# Patient Record
Sex: Female | Born: 1935 | Race: White | Hispanic: No | Smoking: Never smoker
Health system: Southern US, Community
[De-identification: ages and names within clinical notes are randomized; demographics above are authoritative.]

## PROBLEM LIST (undated history)

## (undated) DIAGNOSIS — K5900 Constipation, unspecified: Secondary | ICD-10-CM

## (undated) DIAGNOSIS — I10 Essential (primary) hypertension: Secondary | ICD-10-CM

## (undated) DIAGNOSIS — K219 Gastro-esophageal reflux disease without esophagitis: Secondary | ICD-10-CM

## (undated) DIAGNOSIS — M810 Age-related osteoporosis without current pathological fracture: Secondary | ICD-10-CM

## (undated) DIAGNOSIS — R32 Unspecified urinary incontinence: Secondary | ICD-10-CM

## (undated) DIAGNOSIS — N952 Postmenopausal atrophic vaginitis: Secondary | ICD-10-CM

## (undated) DIAGNOSIS — N362 Urethral caruncle: Secondary | ICD-10-CM

## (undated) DIAGNOSIS — I639 Cerebral infarction, unspecified: Secondary | ICD-10-CM

## (undated) DIAGNOSIS — IMO0002 Reserved for concepts with insufficient information to code with codable children: Secondary | ICD-10-CM

## (undated) DIAGNOSIS — M1990 Unspecified osteoarthritis, unspecified site: Secondary | ICD-10-CM

## (undated) DIAGNOSIS — R351 Nocturia: Secondary | ICD-10-CM

## (undated) DIAGNOSIS — I4891 Unspecified atrial fibrillation: Secondary | ICD-10-CM

## (undated) HISTORY — DX: Gastro-esophageal reflux disease without esophagitis: K21.9

## (undated) HISTORY — DX: Cerebral infarction, unspecified: I63.9

## (undated) HISTORY — DX: Unspecified atrial fibrillation: I48.91

## (undated) HISTORY — DX: Age-related osteoporosis without current pathological fracture: M81.0

## (undated) HISTORY — PX: NO PAST SURGERIES: SHX2092

## (undated) HISTORY — DX: Nocturia: R35.1

## (undated) HISTORY — DX: Essential (primary) hypertension: I10

## (undated) HISTORY — PX: CARPAL TUNNEL RELEASE: SHX101

## (undated) HISTORY — DX: Unspecified urinary incontinence: R32

## (undated) HISTORY — DX: Reserved for concepts with insufficient information to code with codable children: IMO0002

## (undated) HISTORY — DX: Unspecified osteoarthritis, unspecified site: M19.90

## (undated) HISTORY — DX: Postmenopausal atrophic vaginitis: N95.2

## (undated) HISTORY — DX: Constipation, unspecified: K59.00

## (undated) HISTORY — DX: Urethral caruncle: N36.2

---

## 2004-09-16 ENCOUNTER — Emergency Department: Payer: Self-pay | Admitting: Emergency Medicine

## 2004-09-21 ENCOUNTER — Emergency Department: Payer: Self-pay | Admitting: Emergency Medicine

## 2004-09-26 ENCOUNTER — Ambulatory Visit: Payer: Self-pay | Admitting: Gastroenterology

## 2005-02-13 ENCOUNTER — Encounter: Payer: Self-pay | Admitting: Urology

## 2005-03-02 ENCOUNTER — Encounter: Payer: Self-pay | Admitting: Urology

## 2005-03-30 ENCOUNTER — Encounter: Payer: Self-pay | Admitting: Urology

## 2006-01-18 ENCOUNTER — Ambulatory Visit: Payer: Self-pay | Admitting: Internal Medicine

## 2006-08-03 ENCOUNTER — Ambulatory Visit: Payer: Self-pay | Admitting: Urology

## 2006-08-30 ENCOUNTER — Ambulatory Visit: Payer: Self-pay | Admitting: Internal Medicine

## 2007-02-08 ENCOUNTER — Ambulatory Visit: Payer: Self-pay | Admitting: Obstetrics and Gynecology

## 2007-02-11 ENCOUNTER — Ambulatory Visit: Payer: Self-pay | Admitting: Obstetrics and Gynecology

## 2007-05-15 ENCOUNTER — Emergency Department: Payer: Self-pay | Admitting: Emergency Medicine

## 2008-02-09 ENCOUNTER — Ambulatory Visit: Payer: Self-pay | Admitting: Obstetrics and Gynecology

## 2009-03-20 ENCOUNTER — Ambulatory Visit: Payer: Self-pay | Admitting: Internal Medicine

## 2010-02-04 ENCOUNTER — Ambulatory Visit: Payer: Self-pay | Admitting: Internal Medicine

## 2010-02-13 ENCOUNTER — Inpatient Hospital Stay: Payer: Self-pay | Admitting: Internal Medicine

## 2010-03-26 ENCOUNTER — Ambulatory Visit: Payer: Self-pay | Admitting: Internal Medicine

## 2010-04-21 ENCOUNTER — Emergency Department: Payer: Self-pay | Admitting: Emergency Medicine

## 2010-06-17 ENCOUNTER — Ambulatory Visit: Payer: Self-pay | Admitting: Gastroenterology

## 2010-06-19 ENCOUNTER — Emergency Department: Payer: Self-pay | Admitting: Emergency Medicine

## 2010-06-23 ENCOUNTER — Inpatient Hospital Stay: Payer: Self-pay | Admitting: Internal Medicine

## 2010-09-02 ENCOUNTER — Other Ambulatory Visit: Payer: Self-pay | Admitting: Gastroenterology

## 2012-12-23 ENCOUNTER — Ambulatory Visit: Payer: Self-pay | Admitting: Internal Medicine

## 2013-09-29 ENCOUNTER — Ambulatory Visit: Payer: Self-pay | Admitting: Unknown Physician Specialty

## 2013-10-05 ENCOUNTER — Ambulatory Visit: Payer: Self-pay | Admitting: Orthopedic Surgery

## 2013-10-05 LAB — CBC WITH DIFFERENTIAL/PLATELET
Basophil #: 0.1 10*3/uL (ref 0.0–0.1)
HCT: 36.9 % (ref 35.0–47.0)
Lymphocyte #: 1.7 10*3/uL (ref 1.0–3.6)
Lymphocyte %: 26.7 %
MCH: 29.6 pg (ref 26.0–34.0)
Monocyte %: 11 %
Neutrophil %: 55.3 %
Platelet: 264 10*3/uL (ref 150–440)
RBC: 4.27 10*6/uL (ref 3.80–5.20)
RDW: 14.5 % (ref 11.5–14.5)
WBC: 6.4 10*3/uL (ref 3.6–11.0)

## 2013-10-06 ENCOUNTER — Ambulatory Visit: Payer: Self-pay | Admitting: Orthopedic Surgery

## 2013-10-09 LAB — PATHOLOGY REPORT

## 2014-02-12 ENCOUNTER — Ambulatory Visit: Payer: Self-pay | Admitting: Orthopedic Surgery

## 2014-04-18 ENCOUNTER — Emergency Department: Payer: Self-pay | Admitting: Emergency Medicine

## 2014-04-18 LAB — CBC
HCT: 37 % (ref 35.0–47.0)
HGB: 12.2 g/dL (ref 12.0–16.0)
MCH: 28.9 pg (ref 26.0–34.0)
MCHC: 33 g/dL (ref 32.0–36.0)
MCV: 88 fL (ref 80–100)
PLATELETS: 262 10*3/uL (ref 150–440)
RBC: 4.21 10*6/uL (ref 3.80–5.20)
RDW: 14.8 % — ABNORMAL HIGH (ref 11.5–14.5)
WBC: 6.2 10*3/uL (ref 3.6–11.0)

## 2014-04-18 LAB — COMPREHENSIVE METABOLIC PANEL
ALBUMIN: 3.3 g/dL — AB (ref 3.4–5.0)
ALT: 14 U/L (ref 12–78)
AST: 23 U/L (ref 15–37)
Alkaline Phosphatase: 55 U/L
Anion Gap: 4 — ABNORMAL LOW (ref 7–16)
BILIRUBIN TOTAL: 0.3 mg/dL (ref 0.2–1.0)
BUN: 12 mg/dL (ref 7–18)
CO2: 30 mmol/L (ref 21–32)
Calcium, Total: 8.5 mg/dL (ref 8.5–10.1)
Chloride: 104 mmol/L (ref 98–107)
Creatinine: 0.84 mg/dL (ref 0.60–1.30)
EGFR (Non-African Amer.): >60
Glucose: 131 mg/dL — ABNORMAL HIGH (ref 65–99)
OSMOLALITY: 277 (ref 275–301)
Potassium: 3.8 mmol/L (ref 3.5–5.1)
Sodium: 138 mmol/L (ref 136–145)
Total Protein: 6.6 g/dL (ref 6.4–8.2)

## 2014-04-18 LAB — URINALYSIS, COMPLETE
Bilirubin,UR: NEGATIVE
GLUCOSE, UR: NEGATIVE mg/dL (ref 0–75)
Ketone: NEGATIVE
Nitrite: POSITIVE
PROTEIN: NEGATIVE
Ph: 7 (ref 4.5–8.0)
Specific Gravity: 1.005 (ref 1.003–1.030)
Squamous Epithelial: 1
WBC UR: 3 /HPF (ref 0–5)

## 2014-04-18 LAB — TROPONIN I

## 2014-05-09 DIAGNOSIS — I4891 Unspecified atrial fibrillation: Secondary | ICD-10-CM | POA: Insufficient documentation

## 2014-05-09 DIAGNOSIS — I10 Essential (primary) hypertension: Secondary | ICD-10-CM | POA: Insufficient documentation

## 2014-12-25 DIAGNOSIS — G609 Hereditary and idiopathic neuropathy, unspecified: Secondary | ICD-10-CM | POA: Insufficient documentation

## 2014-12-25 DIAGNOSIS — G629 Polyneuropathy, unspecified: Secondary | ICD-10-CM | POA: Insufficient documentation

## 2014-12-25 DIAGNOSIS — E538 Deficiency of other specified B group vitamins: Secondary | ICD-10-CM | POA: Insufficient documentation

## 2015-01-30 DIAGNOSIS — G5603 Carpal tunnel syndrome, bilateral upper limbs: Secondary | ICD-10-CM | POA: Insufficient documentation

## 2015-03-22 NOTE — Op Note (Signed)
PATIENT NAME:  Amber Holland, Amber Holland MR#:  [id number] DATE OF BIRTH:  [birth year]  DATE OF PROCEDURE:  10/06/2013  PREOPERATIVE DIAGNOSIS: L4 compression fracture.   POSTOPERATIVE DIAGNOSIS: L4 compression fracture.  PROCEDURE: L4 biopsy and kyphoplasty.   ANESTHESIA: MAC.   SURGEON: Kennedy BuckerMichael Mivaan Corbitt, M.D.   DESCRIPTION OF PROCEDURE: The patient was brought to the operating room and after adequate sedation was given, the patient was placed prone, and C-arm was brought in and good visualization of L4 was obtained in both AP and lateral projections. After appropriate timeout procedure, 5 mL of 1% Xylocaine was infiltrated subcutaneously after prepping the skin with alcohol. The back was then prepped and draped in the usual sterile manner with a repeat timeout procedure carried out. At this point, the spinal needle was used to give local down to the pedicle on both sides. A small stab incision was made and following the trocar on both AP and lateral projections, it went through the pedicle into the vertebral body, first on the right where a biopsy was obtained, drilling carried out and then a balloon inserted and inflated. The left side had identical procedure without the biopsy. After inflation of the balloons, the cement was inserted throught the cannula  with the appropriate consistency with good fill being obtained. Approximately 6 mL of bone cement was inserted. There was no extravasation. The trocars were removed and permanent C-arm views obtained. The wounds were closed with Dermabond followed by Band-Aids and the patient was sent to the recovery room in stable condition.   ESTIMATED BLOOD LOSS: Minimal.   COMPLICATIONS: None.   SPECIMEN: L4 vertebral body biopsy.   ____________________________ Leitha SchullerMichael J. Jaycie Kregel, MD mjm:aw D: 10/06/2013 16:24:22 ET T: 10/06/2013 16:54:17 ET JOB#: [id number]  cc: Leitha SchullerMichael J. Ruie Sendejo, MD, <Dictator> Leitha SchullerMICHAEL J Florestine Carmical MD ELECTRONICALLY SIGNED 10/07/2013 18:21

## 2015-06-17 ENCOUNTER — Ambulatory Visit: Payer: Medicare Other | Attending: Orthopedic Surgery | Admitting: Occupational Therapy

## 2015-06-17 DIAGNOSIS — R209 Unspecified disturbances of skin sensation: Secondary | ICD-10-CM | POA: Insufficient documentation

## 2015-06-17 NOTE — Patient Instructions (Signed)
LMB splint  on for R 3rd digit to increase PIP extention  Tendon gliding , opposition   Med N glide   Scar massage

## 2015-06-17 NOTE — Therapy (Signed)
[facility] [address] [place], [place], [address] Phone: [phone]   Fax:  [phone]  Occupational Therapy Treatment  Patient Details  Name: Amber RuedMargaret L Dockery MRN: [id number] Date of Birth: 05/27/1920 Referring Provider:  Kennedy BuckerMenz, Michael, MD  Encounter Date: 06/17/2015      OT End of Session - 06/17/15 2124    Visit Number 1   Number of Visits 6   Date for OT Re-Evaluation 07/08/15   OT Start Time 1434   OT Stop Time 1537   OT Time Calculation (min) 63 min   Activity Tolerance Patient tolerated treatment well   Behavior During Therapy [facility] for tasks assessed/performed      No past medical history on file.  No past surgical history on file.  There were no vitals filed for this visit.  Visit Diagnosis:  Abnormal sensation of upper extremity - Plan: Ot plan of care cert/re-cert      Subjective Assessment - 06/17/15 2114    Subjective  I had my R CTR 04/08/15 and about month before that my L - and that one did fine   Patient Stated Goals I want to use my R hand better , and get the numbness and function better like the L one    Currently in Pain? No/denies            [facility] - 06/17/15 0001    Assessment   Diagnosis CTR bilateral   Onset Date 04/08/15   Assessment Pt present with R CTR more than 2 months out -  R hand cont to have numbness at thumb thru 3rd , report decrease FMC and use of hand - refer to hand therapy    Home  Environment   Lives With Alone   Prior Function   Level of Independence Independent with basic ADLs   Leisure Likes to do some housework, playing cards, reading, puszzle, and did some crocheing in past    Strength   Right Hand Grip (lbs) 30   Right Hand Lateral Pinch 10 lbs   Right Hand 3 Point Pinch 7 lbs   Left Hand Grip (lbs) 20   Left Hand Lateral Pinch 9 lbs   Left Hand 3 Point Pinch 7 lbs   Right Hand AROM   R Index  MCP 0-90 85 Degrees   R Long  PIP 0-100 90 Degrees  -30   R Ring PIP 0-100 --  -10                  OT Treatments/Exercises (OP) - 06/17/15 0001    RUE Paraffin   Number Minutes Paraffin 10 Minutes   RUE Paraffin Location Hand   Comments Prior to scar massgae ed , ther ex                 OT Education - 06/17/15 2124    Education provided Yes   Education Details HEP   Person(s) Educated Patient;Other (comment)   Methods Explanation;Demonstration;Tactile cues;Verbal cues;Handout   Comprehension Verbalized understanding;Returned demonstration;Verbal cues required;Tactile cues required;Need further instruction          OT Short Term Goals - 06/17/15 2134    OT SHORT TERM GOAL #1   Title Pt. will be indep. with HEP to increase R 3rd digit extention and sensation    Baseline -30 3rd , 4th -10 extention   Time 2   Period Weeks   Status New   OT  SHORT TERM GOAL #2   Title Sensation improve on [employer] one level to be able to identify 3/3 objects with R hand    Baseline deep pressure and 4.56 at the best at 2nd prox phalanges   Time 3   Period Weeks   Status New           OT Long Term Goals - 15-Jul-2015 04-27-2138    OT LONG TERM GOAL #1   Title Pt to have knowledge of 1-3 AE to use to increase independence    Baseline no knowledge    Period Weeks   Status New               Plan - 07-15-15 04-27-2124    Clinical Impression Statement Pt present more than 8 wks postop R CTR - cont to have sensory issue in R hand with 3rd the worse - but only deep pressure on thumb thru 3rd - could feel 4.56 diminished protective sensation on prox phalanges of 2nd -  decrease 3rd and 4th PIP extention- all limiting her dexterity/fmc and propioception -  ,   Pt will benefit from skilled therapeutic intervention in order to improve on the following deficits (Retired) Impaired flexibility;Impaired sensation;Impaired UE functional use;Decreased range of motion;Decreased scar mobility;Decreased  strength;Decreased coordination   Rehab Potential Fair   Clinical Impairments Affecting Rehab Potential age, memory,   OT Frequency 2x / week   OT Duration Other (comment)   OT Treatment/Interventions Patient/family education;Therapeutic exercises;Passive range of motion;Scar mobilization;Manual Therapy;Parrafin;Ultrasound;Self-care/ADL training   Plan Assess LMB splint use , and HEP    OT Home Exercise Plan see pt instruction     Consulted and Agree with Plan of Care Patient;Other (Comment)          G-Codes - 2015/07/15 04/27/40    Functional Assessment Tool Used ROM, grip and prehension strength, Semmes Weinstien and clinical judgement   Functional Limitation Self care   Self Care Current Status [phone]) At least 40 percent but less than 60 percent impaired, limited or restricted   Self Care Goal Status ([id number]) At least 20 percent but less than 40 percent impaired, limited or restricted      Problem List There are no active problems to display for this patient.   Oletta Cohn OTR/L, CLT July 15, 2015, 9:45 PM  [facility] PHYSICAL AND SPORTS MEDICINE [address] [place], [address] Phone: [phone]   Fax:  [phone]

## 2015-06-19 ENCOUNTER — Ambulatory Visit: Payer: Medicare Other | Admitting: Occupational Therapy

## 2015-06-19 DIAGNOSIS — R209 Unspecified disturbances of skin sensation: Secondary | ICD-10-CM | POA: Diagnosis not present

## 2015-06-19 NOTE — Patient Instructions (Signed)
Same HEP - needed Mod A for Nerve glide and lumbrical fist during tendon gliding

## 2015-06-19 NOTE — Therapy (Signed)
[facility] [address] [place], [place], [address] Phone: [phone]   Fax:  [phone]  Occupational Therapy Treatment  Patient Details  Name: Amber RuedMargaret L Stiehl MRN: [id number] Date of Birth: 07-08-1920 Referring Provider:  Kennedy BuckerMenz, Michael, MD  Encounter Date: 06/19/2015      OT End of Session - 06/19/15 1517    Visit Number 2   Number of Visits 6   Date for OT Re-Evaluation 07/08/15   OT Start Time 1429   OT Stop Time 1515   OT Time Calculation (min) 46 min   Activity Tolerance Patient tolerated treatment well   Behavior During Therapy [facility] for tasks assessed/performed      No past medical history on file.  No past surgical history on file.  There were no vitals filed for this visit.  Visit Diagnosis:  Abnormal sensation of upper extremity      Subjective Assessment - 06/19/15 1438    Subjective  I felt very good today - used R hand more - like eating ,and some cleaning in the corners - still hard time ear rings in    Patient Stated Goals I want to use my R hand better , and get the numbness and function better like the L one    Currently in Pain? No/denies            [facility] Assessment - 06/19/15 0001    Right Hand AROM   R Long PIP 0-100 95 Degrees  -20 coming in                   OT Treatments/Exercises (OP) - 06/19/15 0001    Hand Exercises   Other Hand Exercises PROM of 3rd PIP exention , lumbrical stretch PIP extention    Other Hand Exercises Tendon gliding AROM and AAROm for lumbrical fist , 3rd digit flexion with others blocked ;Med N glides 5 reps    Ultrasound   Ultrasound Location CT scar   Ultrasound Parameters CUS at 3.3MHZ for 4 min prior to manaul thepay    Ultrasound Goals Other (Comment)   RUE Paraffin   Number Minutes Paraffin 10 Minutes   RUE Paraffin Location Hand   [facility] numnbess and increase ROM    Manual Therapy   Manual  Therapy Soft tissue mobilization   Manual therapy comments Scar massage and mobs done after US and parafin - ed pt to do at home                 OT Education - 06/19/15 1517    Education provided Yes   Education Details HEP   Person(s) Educated Patient   Methods Explanation;Demonstration;Tactile cues   Comprehension Verbalized understanding;Returned demonstration;Verbal cues required;Need further instruction          OT Short Term Goals - 06/17/15 2134    OT SHORT TERM GOAL #1   Title Pt. will be indep. with HEP to increase R 3rd digit extention and sensation    Baseline -30 3rd , 4th -10 extention   Time 2   Period Weeks   Status New   OT SHORT TERM GOAL #2   Title Sensation improve on [employer] one level to be able to identify 3/3 objects with R hand    Baseline deep pressure and 4.56 at the best at 2nd prox phalanges   Time 3   Period Weeks   Status New  OT Long Term Goals - 06/17/15 2139    OT LONG TERM GOAL #1   Title Pt to have knowledge of 1-3 AE to use to increase independence    Baseline no knowledge    Period Weeks   Status New               Plan - 06/19/15 1518    Clinical Impression Statement Pt report she used her R hand better this date , but still numb the 3rd digit more than L - did show increase PIP extention at 3rd coming in - cont with same HEP to decrease sensory impairement , ROM , grip    Pt will benefit from skilled therapeutic intervention in order to improve on the following deficits (Retired) Impaired flexibility;Impaired sensation;Impaired UE functional use;Decreased range of motion;Decreased scar mobility;Decreased strength;Decreased coordination   Rehab Potential Fair   Clinical Impairments Affecting Rehab Potential age, memory,   OT Frequency 2x / week   OT Duration Other (comment)   OT Treatment/Interventions Patient/family education;Therapeutic exercises;Passive range of motion;Scar mobilization;Manual  Therapy;Parrafin;Ultrasound;Self-care/ADL training   Plan Review HEP and upgrade    OT Home Exercise Plan see pt instruction     Consulted and Agree with Plan of Care Patient        Problem List There are no active problems to display for this patient.   Grayling Schranz OTR/L, CLT 06/19/2015, 3:22 PM  [place] PHYSICAL AND SPORTS MEDICINE [address], [place], [address] Phone: [phone]   Fax:  [phone]

## 2015-06-20 ENCOUNTER — Encounter: Payer: Medicare Other | Admitting: Occupational Therapy

## 2015-06-24 ENCOUNTER — Ambulatory Visit: Payer: Medicare Other | Admitting: Occupational Therapy

## 2015-06-24 DIAGNOSIS — R209 Unspecified disturbances of skin sensation: Secondary | ICD-10-CM | POA: Diagnosis not present

## 2015-06-24 NOTE — Therapy (Signed)
[facility] PH[facility]TS MEDICINE [address], [place], [address] Phone: [phone]   Fax:  [phone]  Occupational Therapy Treatment  Patient Details  Name: Amber Holland MRN: [id number] Date of Birth: 1920/05/02 Referring Provider:  Kennedy Bucker, MD  Encounter Date: 06/24/2015      OT End of Session - 06/24/15 1529    Visit Number 3   Number of Visits 6   Date for OT Re-Evaluation 07/08/15   OT Start Time 1432   OT Stop Time 1520   OT Time Calculation (min) 48 min   Activity Tolerance Patient tolerated treatment well   Behavior During Therapy [facility] for tasks assessed/performed      No past medical history on file.  No past surgical history on file.  There were no vitals filed for this visit.  Visit Diagnosis:  Abnormal sensation of upper extremity      Subjective Assessment - 06/24/15 1525    Subjective  I am using it more - an I think part of it , is that I feel better -and that middle finger has arthritis in - I did the scar massage but don't know if I am doing it correctly    Patient Stated Goals I want to use my R hand better , and get the numbness and function better like the L one    Currently in Pain? No/denies            [facility] OT Assessment - 06/24/15 0001    Strength   Right Hand Grip (lbs) 30   Right Hand Lateral Pinch 11 lbs   Right Hand 3 Point Pinch 7 lbs   Left Hand Grip (lbs) 25   Left Hand Lateral Pinch 12 lbs   Left Hand 3 Point Pinch 7 lbs   Right Hand AROM   R Index  MCP 0-90 85 Degrees   R Long PIP 0-100 98 Degrees  -30   R Ring PIP 0-100 --  -10                  OT Treatments/Exercises (OP) - 06/24/15 0001    Hand Exercises   Other Hand Exercises PROM of 3rd PIP exention , lumbrical stretch PIP extention    Other Hand Exercises Tendon gliding AROM and AAROm for lumbrical fist , 3rd digit flexion with others blocked ;Med N glides 5 reps    RUE Paraffin   Number Minutes Paraffin 10 Minutes   RUE Paraffin Location Hand   Comments At [facility] to decrease scar massge    Manual Therapy   Manual Therapy Soft tissue mobilization   Manual therapy comments Scar massage and mobs done afterparafin - ed pt to do at home ; sensory reed , finding small objects in rice using only first 3 digits - but needed to look - also did [facility] - out of hand to figner manipulation - 3point out                 OT Education - 06/24/15 1528    Education provided Yes   Education Details same HEP   Person(s) Educated Patient   Methods Explanation;Demonstration;Tactile cues   Comprehension Returned demonstration;Verbalized understanding;Verbal cues required          OT Short Term Goals - 06/24/15 1531    OT SHORT TERM GOAL #1   Title Pt. will be indep. with HEP to increase R 3rd digit extention and sensation  Baseline -30 3rd , 4th -10 extention   Time 2   Period Weeks   Status On-going   OT SHORT TERM GOAL #2   Title Sensation improve on [employer] one level to be able to identify 3/3 objects with R hand    Baseline deep pressure and 4.56 at the best at 2nd prox phalanges   Time 2   Period Weeks   Status On-going           OT Long Term Goals - 06/24/15 1531    OT LONG TERM GOAL #1   Title Pt to have knowledge of 1-3 AE to use to increase independence    Baseline no knowledge    Time 2   Period Weeks   Status On-going               Plan - 06/24/15 1529    Clinical Impression Statement Pt appear to use hand better , lateral grip improved on R and grip on L - pt had hardtime with 3 point grip because of sensation problems - 3rd digit the worse - did small objects out of rice and could not do with first 3 digits - had to look    Pt will benefit from skilled therapeutic intervention in order to improve on the following deficits (Retired) Impaired flexibility;Impaired sensation;Impaired UE functional use;Decreased range of  motion;Decreased scar mobility;Decreased strength;Decreased coordination   Rehab Potential Fair   Clinical Impairments Affecting Rehab Potential age, memory,   OT Frequency 2x / week   OT Duration 2 weeks   OT Treatment/Interventions Patient/family education;Therapeutic exercises;Passive range of motion;Scar mobilization;Manual Therapy;Parrafin;Ultrasound;Self-care/ADL training   Plan HEP , semmes weinstein    OT Home Exercise Plan see pt instruction     Consulted and Agree with Plan of Care Patient        Problem List There are no active problems to display for this patient.   Oletta Cohn OTR/L;CLT  06/24/2015, 3:33 PM  [place] [facility] PHYSICAL [facility] SPORTS MEDICINE [address], [place], [address] Phone: [phone]   Fax:  [phone]

## 2015-06-26 ENCOUNTER — Ambulatory Visit: Payer: Medicare Other | Admitting: Occupational Therapy

## 2015-06-26 DIAGNOSIS — R209 Unspecified disturbances of skin sensation: Secondary | ICD-10-CM | POA: Diagnosis not present

## 2015-06-26 NOTE — Therapy (Signed)
[place] [facility] PHYSICAL [facility] SPORTS MEDICINE [address] [place], [address] Phone: [phone]   Fax:  [phone]  Occupational Therapy Treatment  Patient Details  Name: Amber Holland MRN: [id number] Date of Birth: 08-Jan-1920 Referring Provider:  Kennedy Bucker, MD  Encounter Date: 06/26/2015      OT End of Session - 06/26/15 2120    Visit Number 4   Number of Visits 6   Date for OT Re-Evaluation 07/08/15   OT Start Time 1433   OT Stop Time 1522   OT Time Calculation (min) 49 min   Activity Tolerance Patient tolerated treatment well   Behavior During Therapy [facility] for tasks assessed/performed      No past medical history on file.  No past surgical history on file.  There were no vitals filed for this visit.  Visit Diagnosis:  Abnormal sensation of upper extremity      Subjective Assessment - 06/26/15 2110    Subjective  My hand is just not doing good today - I woke up this am with my middle finger hurting and more pins and needles  but it could be my arthritis too - I just sweep some today    Patient Stated Goals I want to use my R hand better , and get the numbness and function better like the L one    Currently in Pain? Other (Comment)                      OT Treatments/Exercises (OP) - 06/26/15 0001    Hand Exercises   Other Hand Exercises THumb PA and RA , Med N glides - still needs Mod A for HEP   Other Hand Exercises Tendon gliding  done    Ultrasound   Ultrasound Location CT scar on R   Ultrasound Parameters CUS at 3.66mhz at 0.8 intensity forr 5 min prior to scar massage and CT spreads   Ultrasound Goals Pain   RUE Paraffin   Number Minutes Paraffin 10 Minutes   RUE Paraffin Location Hand   Comments At [facility] to decreaes pain    Manual Therapy   Manual Therapy Soft tissue mobilization   Manual therapy comments Scar massage and mobs done afterparafin - ed pt to do at home ; sensory reed , finding  small objects in rice using only first 3 digits - but needed to look - also did [facility] - out of hand to figner manipulation - 3point out                 OT Education - 06/26/15 2119    Education provided Yes   Education Details Same HEP and sensory reed   Person(s) Educated Patient   Methods Explanation;Demonstration;Tactile cues;Verbal cues   Comprehension Verbalized understanding;Returned demonstration;Verbal cues required;Tactile cues required          OT Short Term Goals - 06/24/15 1531    OT SHORT TERM GOAL #1   Title Pt. will be indep. with HEP to increase R 3rd digit extention and sensation    Baseline -30 3rd , 4th -10 extention   Time 2   Period Weeks   Status On-going   OT SHORT TERM GOAL #2   Title Sensation improve on [employer] one level to be able to identify 3/3 objects with R hand    Baseline deep pressure and 4.56 at the best at 2nd prox phalanges   Time 2   Period Weeks  Status On-going           OT Long Term Goals - 06/24/15 1531    OT LONG TERM GOAL #1   Title Pt to have knowledge of 1-3 AE to use to increase independence    Baseline no knowledge    Time 2   Period Weeks   Status On-going               Plan - 06/26/15 2122    Clinical Impression Statement Pt had increase pain and sensory issue per pt this date - got up this am with it - could be way pt slept or she has history of arthritis - pt to pay attention how she sleeps with hands and do some sensory issue during HEP and day time - sensation in hand and proximal phalanges better than at DIP's and PIP 's - pt to cont 2 x wk for 1 more wk   Pt will benefit from skilled therapeutic intervention in order to improve on the following deficits (Retired) Impaired flexibility;Impaired sensation;Impaired UE functional use;Decreased range of motion;Decreased scar mobility;Decreased strength;Decreased coordination   Rehab Potential Fair   Clinical Impairments Affecting Rehab Potential  age, memory,   OT Frequency 2x / week   OT Duration 2 weeks   OT Treatment/Interventions Patient/family education;Therapeutic exercises;Passive range of motion;Scar mobilization;Manual Therapy;Parrafin;Ultrasound;Self-care/ADL training   Plan Pain next sensation and HEP - assess grip and prehension    OT Home Exercise Plan see pt instruction     Consulted and Agree with Plan of Care Patient        Problem List There are no active problems to display for this patient.   Oletta Cohn OTR/L,CLT 06/26/2015, 9:26 PM  [place] [facility] PHYSICAL AND SPORTS MEDICINE [address], [place], [address] Phone: [phone]   Fax:  [phone]

## 2015-06-26 NOTE — Patient Instructions (Signed)
Reinforce for pt to do sensory reed HEP - different textures

## 2015-07-01 ENCOUNTER — Ambulatory Visit: Payer: Medicare Other | Attending: Orthopedic Surgery | Admitting: Occupational Therapy

## 2015-07-01 DIAGNOSIS — R209 Unspecified disturbances of skin sensation: Secondary | ICD-10-CM | POA: Diagnosis not present

## 2015-07-01 NOTE — Therapy (Signed)
[facility]OR[facility] MEDICINE [address], [place], [address] Phone: [phone]   Fax:  [phone]  Occupational Therapy Treatment  Patient Details  Name: Amber Holland MRN: [id number] Date of Birth: August 20, 1920 Referring Provider:  Kennedy Bucker, MD  Encounter Date: 07/01/2015      OT End of Session - 07/01/15 1545    Visit Number 5   Number of Visits 6   Date for OT Re-Evaluation 07/08/15   OT Start Time 1445   OT Stop Time 1533   OT Time Calculation (min) 48 min   Activity Tolerance Patient tolerated treatment well   Behavior During Therapy [facility] for tasks assessed/performed      No past medical history on file.  No past surgical history on file.  There were no vitals filed for this visit.  Visit Diagnosis:  Abnormal sensation of upper extremity      Subjective Assessment - 07/01/15 1451    Subjective  My hand still bothering  me a lot - no pain but numbness in the finger - but today my ring finger  was numb some what - that is new    Patient Stated Goals I want to use my R hand better , and get the numbness and function better like the L one    Currently in Pain? No/denies            [facility] OT Assessment - 07/01/15 0001    Strength   Right Hand Grip (lbs) 20   Left Hand Grip (lbs) 20                  OT Treatments/Exercises (OP) - 07/01/15 0001    Hand Exercises   Other Hand Exercises Med N glides reviewed - needed SBA    Other Hand Exercises Tendon glides and pt to not flex wrist, Prayer stretch for wrist etxention    Ultrasound   Ultrasound Location CT scar   Ultrasound Parameters CUS at 3. and 0.8 intensity for 5 min    Ultrasound Goals Pain   RUE Paraffin   Number Minutes Paraffin 10 Minutes   RUE Paraffin Location Hand   Comments at [facility] to decrease scar tissue    Manual Therapy   Manual therapy comments scar massage and mobs - discuss with sensory reed, different  textures and objecst - pay attention how improve                 OT Education - 07/01/15 1544    Education provided Yes   Education Details HEP   Person(s) Educated Patient   Methods Explanation;Demonstration;Tactile cues;Verbal cues   Comprehension Verbalized understanding;Returned demonstration;Verbal cues required;Tactile cues required          OT Short Term Goals - 06/24/15 1531    OT SHORT TERM GOAL #1   Title Pt. will be indep. with HEP to increase R 3rd digit extention and sensation    Baseline -30 3rd , 4th -10 extention   Time 2   Period Weeks   Status On-going   OT SHORT TERM GOAL #2   Title Sensation improve on [employer] one level to be able to identify 3/3 objects with R hand    Baseline deep pressure and 4.56 at the best at 2nd prox phalanges   Time 2   Period Weeks   Status On-going           OT Long Term Goals - 06/24/15 1531  OT LONG TERM GOAL #1   Title Pt to have knowledge of 1-3 AE to use to increase independence    Baseline no knowledge    Time 2   Period Weeks   Status On-going               Plan - 07/01/15 1546    Clinical Impression Statement Pt cont to have sensory issues ofthumb thru 3rd digit - pt to be reasses next session  - did remind she is not yet 2 months out of surgery and had cellulitis with this recovery and cannot compare it to the L -    Pt will benefit from skilled therapeutic intervention in order to improve on the following deficits (Retired) Impaired flexibility;Impaired sensation;Impaired UE functional use;Decreased range of motion;Decreased scar mobility;Decreased strength;Decreased coordination   Rehab Potential Fair   Clinical Impairments Affecting Rehab Potential age, memory,   OT Frequency 2x / week   OT Duration Other (comment)   OT Treatment/Interventions Patient/family education;Therapeutic exercises;Passive range of motion;Scar mobilization;Manual Therapy;Parrafin;Ultrasound;Self-care/ADL  training   Plan Reassess next session   OT Home Exercise Plan see pt instruction     Consulted and Agree with Plan of Care Patient        Problem List There are no active problems to display for this patient.   Oletta Cohn OTR/L,CLT 07/01/2015, 3:49 PM  [place] [facility] PHYSICAL AND SPORTS MEDICINE [address] [place], [address] Phone: [phone]   Fax:  [phone]

## 2015-07-01 NOTE — Patient Instructions (Signed)
Same for tendo glides and Med N glides   Sensory reed to cont

## 2015-07-03 ENCOUNTER — Telehealth: Payer: Self-pay | Admitting: Obstetrics and Gynecology

## 2015-07-03 ENCOUNTER — Ambulatory Visit: Payer: Medicare Other | Admitting: Occupational Therapy

## 2015-07-03 DIAGNOSIS — R209 Unspecified disturbances of skin sensation: Secondary | ICD-10-CM

## 2015-07-03 NOTE — Telephone Encounter (Signed)
Pt  Is a lot worse since she got pessary put it. She is going thru adult diapers like crazy. Her pee is just running out. She does have an appt 8/16 and just wanted to know if she needs to take it out or maybe doesn't fit right.

## 2015-07-03 NOTE — Patient Instructions (Signed)
Add teal putty for gripping , lateral and 3 point grip  cont with same HEP

## 2015-07-03 NOTE — Therapy (Signed)
[place] PHYSICAL AND SPORTS MEDICINE [address], [place], [address] Phone: [phone]   Fax:  [phone]  Occupational Therapy Treatment  Patient Details  Name: Amber Holland MRN: [id number] Date of Birth: 1920-05-26 Referring Provider:  Hessie Knows, MD  Encounter Date: 07/03/2015      OT End of Session - 07/03/15 1701    Visit Number 6   Number of Visits 6   Date for OT Re-Evaluation 07/08/15   OT Start Time 1424   OT Stop Time 1512   OT Time Calculation (min) 48 min   Activity Tolerance Patient tolerated treatment well   Behavior During Therapy [facility] for tasks assessed/performed      No past medical history on file.  No past surgical history on file.  There were no vitals filed for this visit.  Visit Diagnosis:  Abnormal sensation of upper extremity      Subjective Assessment - 07/03/15 1436    Subjective  My hand was just hurting - my index finger was just shot of pain - but I did spray ants with spray can and cleaned a lot since yesterday - but I would like to see Dr Rudene Christians before I go out of town   Patient Stated Goals I want to use my R hand better , and get the numbness and function better like the L one    Currently in Pain? No/denies            [facility] OT Assessment - 07/03/15 0001    Strength   Right Hand Grip (lbs) 20   Right Hand Lateral Pinch 11 lbs   Right Hand 3 Point Pinch 7 lbs   Left Hand Grip (lbs) 20   Left Hand Lateral Pinch 12 lbs   Left Hand 3 Point Pinch 7 lbs   Right Hand AROM   R Index  MCP 0-90 85 Degrees   R Long PIP 0-100 96 Degrees  -25                  OT Treatments/Exercises (OP) - 07/03/15 0001    Hand Exercises   Other Hand Exercises measured grip and prehension     Other Hand Exercises Add teal putty fror gripping, , lat and 3point grip  10 reps 2 x day    RUE Paraffin   Number Minutes Paraffin 10 Minutes   RUE Paraffin Location Hand   Comments SOC to  decrease scar tissue and decrease stiffness with arthrtis   Manual Therapy   Manual therapy comments scar massage and mobs - discuss with sensory reed, different textures and objecst - pay attention how improve                 OT Education - 07/03/15 1659    Education provided Yes   Education Details HEP   Person(s) Educated Patient   Methods Explanation;Demonstration;Tactile cues;Handout;Verbal cues   Comprehension Verbalized understanding;Returned demonstration;Verbal cues required;Tactile cues required          OT Short Term Goals - 07/03/15 1707    OT SHORT TERM GOAL #1   Title Pt. will be indep. with HEP to increase R 3rd digit extention and sensation    Baseline -25 3rd but pt says  extention was limited with her arthritis before   Status Partially Met   OT SHORT TERM GOAL #2   Title Sensation improve on [place] one level to be able to identify 3/3 objects with  R hand    Baseline deep pressure and 4.56 at the best at 2nd prox phalanges   Status Partially Met           OT Long Term Goals - 2015-07-27 1708    OT LONG TERM GOAL #1   Title Pt to have knowledge of 1-3 AE to use to increase independence    Status Achieved               Plan - Jul 27, 2015 1703    Clinical Impression Statement Pt cont to show sensory changes in 2nd and 3rd digits more than thumb - pt is limited by memory about progress in sensation, use of hand - and some times she has increase pain in 2nd digit - but later you found our she used spray can for ants for while - she do show impaired  sensation mostly from proximal phalanges to DIP at 2nd and 3rd - palm intact - pt now only 8-9 wks out of surgery - did discuss nerve healing in length - but again memory limited her understanding or info and HEP - pt  did want appt with Dr Rudene Christians prior to going out of town - did schedule  appt - feel pt can be discharge from  hand therapy /OT at this time with HEP    Pt will benefit from skilled  therapeutic intervention in order to improve on the following deficits (Retired) Impaired flexibility;Impaired sensation;Impaired UE functional use;Decreased range of motion;Decreased scar mobility;Decreased strength;Decreased coordination   Rehab Potential Fair   Clinical Impairments Affecting Rehab Potential age, memory,   OT Treatment/Interventions Patient/family education;Therapeutic exercises;Passive range of motion;Scar mobilization;Manual Therapy;Parrafin;Ultrasound;Self-care/ADL training   Plan discharge with HEP - appt with MD for 10 Aug   OT Home Exercise Plan see pt instruction    Consulted and Agree with Plan of Care Patient          G-Codes - 07-27-2015 1708    Functional Assessment Tool Used ROM, grip and prehension strength, [place] and clinical judgement   Functional Limitation Self care   Self Care Current Status [phone]) At least 20 percent but less than 40 percent impaired, limited or restricted   Self Care Goal Status ([id number]) At least 20 percent but less than 40 percent impaired, limited or restricted   Self Care Discharge Status [id number]) At least 20 percent but less than 40 percent impaired, limited or restricted      Problem List There are no active problems to display for this patient.   Rosalyn Gess OTR/L,CLT July 27, 2015, 5:09 PM  [place] PHYSICAL AND SPORTS MEDICINE [address] [place], [address] Phone: [phone]   Fax:  [phone]

## 2015-07-05 NOTE — Telephone Encounter (Signed)
I spoke with daughter and pt beside her that Dr. Valentino Saxon suggested a different pessary (ring with knob #2). Will call when this comes in and have appt made for insertion.

## 2015-07-11 ENCOUNTER — Ambulatory Visit: Payer: Self-pay | Admitting: Obstetrics and Gynecology

## 2015-07-16 ENCOUNTER — Ambulatory Visit: Payer: Self-pay | Admitting: Obstetrics and Gynecology

## 2015-08-27 ENCOUNTER — Encounter: Payer: Self-pay | Admitting: Obstetrics and Gynecology

## 2015-08-27 ENCOUNTER — Ambulatory Visit (INDEPENDENT_AMBULATORY_CARE_PROVIDER_SITE_OTHER): Payer: Medicare Other | Admitting: Obstetrics and Gynecology

## 2015-08-27 VITALS — BP 129/66 | HR 81 | Ht 59.0 in | Wt 117.6 lb

## 2015-08-27 DIAGNOSIS — N3945 Continuous leakage: Secondary | ICD-10-CM | POA: Diagnosis not present

## 2015-08-27 DIAGNOSIS — R32 Unspecified urinary incontinence: Secondary | ICD-10-CM | POA: Insufficient documentation

## 2015-08-27 DIAGNOSIS — N8110 Cystocele, unspecified: Secondary | ICD-10-CM

## 2015-08-27 DIAGNOSIS — N8111 Cystocele, midline: Secondary | ICD-10-CM | POA: Insufficient documentation

## 2015-08-27 DIAGNOSIS — IMO0002 Reserved for concepts with insufficient information to code with codable children: Secondary | ICD-10-CM

## 2015-08-27 NOTE — Progress Notes (Signed)
GYNECOLOGY PROGRESS NOTE  Subjective:    Patient ID: Amber Holland, female    DOB: 1920-05-06, 81 y.o.   MRN: [id number]  HPI  Patient is a 82 y.o. female who presents for a pessary re-insertion.  Patient was previously using a size 2 ring with support which initially was working well, however now is causing more urinary leakage and nocturnal enuresis.  Now is having to wear adult diapers.  Took out last pessary at home.  Presents for insertion of new pessary (size 2 ring with knob).   The following portions of the patient's history were reviewed and updated as appropriate: allergies, current medications, past family history, past medical history, past social history, past surgical history and problem list.  Review of Systems Pertinent items are noted in HPI.   Objective:   Blood pressure 129/66, pulse 81, height  (1.499 m), weight 117 lb 9.6 oz (53.343 kg). General appearance: alert and no distress Abdomen: soft, non-tender; bowel sounds normal; no masses,  no organomegaly Pelvis: deferred.     Assessment:   Cystocele Urinary incontinence with nocturnal enuresis  Plan:   New pessary inserted today (ring with knob, size 2). The patient should return in 3 months for a pessary check and continue to use vaginal Trimo-San gel once or twice weekly as prescribed. To notify MD in 2 weeks on progress of new pessary.  Also discussed medications that could make patient's urinary incontinence and nocturnal enuresis worse, including her HCTZ (water pill).  Patient notes that she has been on this for years, and is wondering if she even still needs this.  Advised to discuss with her PCP about possibly discontinuing or changing to a different pill.   Hildred Laser, MD [facility]

## 2015-10-14 ENCOUNTER — Telehealth: Payer: Self-pay | Admitting: Obstetrics and Gynecology

## 2015-10-14 NOTE — Telephone Encounter (Signed)
Patients daughter Darl PikesSusan called and wanted to speak with you regarding patients pessary. She can be reached at [phone].Thanks

## 2015-10-18 NOTE — Telephone Encounter (Signed)
Dr.Cherry I know we spoke briefly about this pt but just wanted to confirm that you want her to place her old pessary back in and come in to discuss the incontinence. PT's daughter called and stated that her mother is having incontinence all the time now with this new pessary whereas with the old one it was just at night. Please advise.

## 2015-10-21 NOTE — Telephone Encounter (Signed)
Darl PikesSusan- pts daughter-aware- appt made 11/07/15. 1:45. Reminded to bring old pessary in.

## 2015-10-21 NOTE — Telephone Encounter (Signed)
Yes, that is correct.  Please contact patient and have her scheduled to come in with her old pessary and we will re-insert.

## 2015-11-07 ENCOUNTER — Ambulatory Visit (INDEPENDENT_AMBULATORY_CARE_PROVIDER_SITE_OTHER): Payer: Medicare Other | Admitting: Obstetrics and Gynecology

## 2015-11-07 ENCOUNTER — Encounter: Payer: Self-pay | Admitting: Obstetrics and Gynecology

## 2015-11-07 VITALS — BP 127/65 | HR 75 | Ht 59.0 in | Wt 119.0 lb

## 2015-11-07 DIAGNOSIS — Z4689 Encounter for fitting and adjustment of other specified devices: Secondary | ICD-10-CM | POA: Diagnosis not present

## 2015-11-07 DIAGNOSIS — N8110 Cystocele, unspecified: Secondary | ICD-10-CM

## 2015-11-07 DIAGNOSIS — IMO0002 Reserved for concepts with insufficient information to code with codable children: Secondary | ICD-10-CM

## 2015-11-07 DIAGNOSIS — N3944 Nocturnal enuresis: Secondary | ICD-10-CM | POA: Diagnosis not present

## 2015-11-07 NOTE — Progress Notes (Signed)
GYNECOLOGY PROGRESS NOTE  Subjective:    Patient ID: Amber RuedMargaret L Holland, female    DOB: 03-29-1920, 81 y.o.   MRN: [id number]  HPI  Patient is a 81 y.o. female who presents for a pessary re-insertion.  Patient was previously using a size 2 ring with knob which is causing more urinary leakage during the day. Now is having to wear adult diapers day and night.  Desires to have original pessary reinserted (ring with support).    The following portions of the patient's history were reviewed and updated as appropriate: allergies, current medications, past family history, past medical history, past social history, past surgical history and problem list.  Review of Systems Pertinent items are noted in HPI.   Objective:   Blood pressure 127/65, pulse 75, height 4\' 11"  (1.499 m), weight 119 lb (53.978 kg). General appearance: alert and no distress Abdomen: soft, non-tender; bowel sounds normal; no masses,  no organomegaly Pelvis: deferred.     Assessment:   Cystocele Urinary incontinence with nocturnal enuresis  Plan:   - Ring with knob pessary removed and ring with supprt reinserted today. The patient should return in 2-3 months for a pessary check and continue to use vaginal Trimo-San gel once or twice weekly as prescribed.  - Also reiterated medications that could make patient's urinary incontinence and nocturnal enuresis worse, including her HCTZ (water pill).  Patient and family member note that they will do a trial of decreasing pill to every other day for a week to see if this improves her nocturnal enuresis.  Is currently on Vesicare 10 mg qhs.    Hildred LaserAnika Isabellarose Kope, MD [facility]

## 2015-11-28 ENCOUNTER — Ambulatory Visit: Payer: Medicare Other | Admitting: Obstetrics and Gynecology

## 2016-01-02 ENCOUNTER — Ambulatory Visit (INDEPENDENT_AMBULATORY_CARE_PROVIDER_SITE_OTHER): Payer: Medicare Other | Admitting: Obstetrics and Gynecology

## 2016-01-02 ENCOUNTER — Encounter: Payer: Self-pay | Admitting: Obstetrics and Gynecology

## 2016-01-02 VITALS — BP 136/65 | HR 75 | Ht 59.0 in | Wt 120.7 lb

## 2016-01-02 DIAGNOSIS — N3944 Nocturnal enuresis: Secondary | ICD-10-CM | POA: Diagnosis not present

## 2016-01-02 DIAGNOSIS — IMO0002 Reserved for concepts with insufficient information to code with codable children: Secondary | ICD-10-CM

## 2016-01-02 DIAGNOSIS — N8110 Cystocele, unspecified: Secondary | ICD-10-CM | POA: Diagnosis not present

## 2016-01-02 DIAGNOSIS — Z960 Presence of urogenital implants: Secondary | ICD-10-CM

## 2016-01-02 DIAGNOSIS — Z9289 Personal history of other medical treatment: Secondary | ICD-10-CM

## 2016-01-02 NOTE — Progress Notes (Signed)
    GYNECOLOGY PROGRESS NOTE  Subjective:    Patient ID: Amber Holland, female    DOB: Jul 15, 1920, 82 y.o.   MRN: [id number]  HPI  Patient is a 82 y.o. female who presents for pessary check.  She reports no vaginal bleeding or discharge. She denies pelvic discomfort and difficulty urinating or moving her bowels.  Patient's daughter notes that she has discontinued patient's Vesicare, and decreased the HCTZ to every other day, which has allowed patient's mental status to become "less foggy" and decreased nocturnal enuresis.  Also notes that she is doing timed voiding with patient (q 1 hr).    The following portions of the patient's history were reviewed and updated as appropriate: allergies, current medications, past family history, past medical history, past social history, past surgical history and problem list.  Review of Systems Pertinent items noted in HPI and remainder of comprehensive ROS otherwise negative.   Objective:   Blood pressure 136/65, pulse 75, height  (1.499 m), weight 120 lb 11.2 oz (54.749 kg). General appearance: alert and no distress Pelvic:  ?The patient's Size 2 ring with support  pessary was removed, cleaned and replaced without complications. Speculum examination revealed normal vaginal mucosa with no lesions or lacerations.   Assessment:   Vaginal pessary in situ Nocturnal enuresis Cystocele  Plan:   Symptoms improved with current interventions.  Patient can decrease timed voiding to q 2-3 hrs.  Continue to hold Vesicare, and every other day HCTZ.  BPs wnl. The patient should return in 10 weeks  for a pessary check and continue to use vaginal estrogen cream weekly as prescribed.    Hildred Laser, MD [facility]

## 2016-03-12 ENCOUNTER — Ambulatory Visit (INDEPENDENT_AMBULATORY_CARE_PROVIDER_SITE_OTHER): Payer: Medicare Other | Admitting: Obstetrics and Gynecology

## 2016-03-12 VITALS — BP 133/68 | HR 70 | Ht 59.0 in | Wt 118.8 lb

## 2016-03-12 DIAGNOSIS — Z4689 Encounter for fitting and adjustment of other specified devices: Secondary | ICD-10-CM | POA: Diagnosis not present

## 2016-03-12 DIAGNOSIS — Z9289 Personal history of other medical treatment: Secondary | ICD-10-CM | POA: Diagnosis not present

## 2016-03-12 DIAGNOSIS — IMO0002 Reserved for concepts with insufficient information to code with codable children: Secondary | ICD-10-CM

## 2016-03-12 DIAGNOSIS — N8110 Cystocele, unspecified: Secondary | ICD-10-CM | POA: Diagnosis not present

## 2016-03-12 DIAGNOSIS — Z960 Presence of urogenital implants: Secondary | ICD-10-CM

## 2016-03-12 DIAGNOSIS — N3945 Continuous leakage: Secondary | ICD-10-CM

## 2016-03-12 MED ORDER — TROSPIUM CHLORIDE 20 MG PO TABS: 20.0000 mg | ORAL_TABLET | Freq: Every day | ORAL | Status: DC

## 2016-03-16 DIAGNOSIS — Z960 Presence of urogenital implants: Secondary | ICD-10-CM | POA: Insufficient documentation

## 2016-03-16 NOTE — Progress Notes (Signed)
    GYNECOLOGY PROGRESS NOTE  Subjective:    Patient ID: Amber Holland, female    DOB: 29-Nov-1920, 82 y.o.   MRN: [id number]  HPI  Patient is a 82 y.o. female who presents for pessary check.  She reports no vaginal bleeding or discharge. She denies pelvic discomfort and difficulty urinating or moving her bowels.  Patient's does note that she is having episodes of leakage despite activity or urge.    The following portions of the patient's history were reviewed and updated as appropriate: allergies, current medications, past family history, past medical history, past social history, past surgical history and problem list.  Review of Systems Pertinent items noted in HPI and remainder of comprehensive ROS otherwise negative.   Objective:   Blood pressure 133/68, pulse 70, height 4\' 11"  (1.499 m), weight 118 lb 12.8 oz (53.887 kg). General appearance: alert and no distress Pelvic:  ?The patient's Size 2 ring with support  pessary was removed, cleaned and replaced without complications. Speculum examination revealed normal vaginal mucosa with no lesions or lacerations.   Assessment:   Vaginal pessary in situ Continuous leakage Cystocele  Plan:    Continue to hold Vesicare, and continue every other day HCTZ.  BPs wnl.  Will change Vesicare to Sanctura as patient was noting "fogginess" while on Vesicare.  The patient should return in 6 weeks  for a pessary check and continue to use vaginal estrogen cream weekly as prescribed.    Hildred LaserAnika Jamicheal Heard, Amber Holland [facility]

## 2016-04-23 ENCOUNTER — Ambulatory Visit: Payer: Medicare Other | Admitting: Obstetrics and Gynecology

## 2016-04-28 ENCOUNTER — Ambulatory Visit: Payer: Medicare Other | Admitting: Obstetrics and Gynecology

## 2016-05-20 ENCOUNTER — Telehealth: Payer: Self-pay | Admitting: Obstetrics and Gynecology

## 2016-05-20 NOTE — Telephone Encounter (Signed)
Please advise 

## 2016-05-20 NOTE — Telephone Encounter (Signed)
Patient called stating she is urinating every hour and she is going on vacation. She didn't know if you had any suggestions about what she could do. She stated she didn't want to come in for an appointment. Thanks

## 2016-05-20 NOTE — Telephone Encounter (Signed)
First assess if she is having any UTI symptoms, if so, may need to drop off a urine sample.  Also, she can resume taking her Vesicare, but I would limit it to 1 tablet maybe every other day, or can take 1/2 tablet daily. She may want to hold on her fluid pills if she is still taking those, and limit fluid intake while on vacation (also avoiding bladder stimulants like caffeine and citrus drinks).   Hildred LaserAnika Teddrick Mallari, MD [facility]

## 2016-05-21 NOTE — Telephone Encounter (Signed)
Called pt, no answer. LM for pt to call back, other number listed.    Called daughter LM for her to call back.

## 2016-05-21 NOTE — Telephone Encounter (Signed)
Called pt, no answer LM informing her of information below.

## 2016-05-25 ENCOUNTER — Telehealth: Payer: Self-pay

## 2016-05-25 NOTE — Telephone Encounter (Signed)
Pt's daughter called and has questions regarding her mother's urinary frequency and questions if anything further could be done. Lengthy conversation had with daughter Darl PikesSusan about pessary vs. Surgery and how her mother was not the best candidate for a surgical procedure due to age and cardiac disease. Also explained to Darl PikesSusan that the pt should avoid urinary stimulants such as caffeine and citrus drinks, also advised on timing voids as to not have accidents. Daughter gave verbal understanding and notes that her mother is already cutting fluid pill back to every other day (see previous message) Darl PikesSusan also notes that her mother is " at the end of her rope" with the urinary issues and just wants a resolution and has some anxiety surrounding this. Gave verbal understanding but explained that at this point resuming Vesicare, and using the pessary in conjunction with the above mentioned remedies is the best case scenario. Daughter gave verbal understanding and states she will reiterate this to patient.

## 2016-08-25 ENCOUNTER — Ambulatory Visit: Payer: Medicare Other | Admitting: Obstetrics and Gynecology

## 2016-08-27 ENCOUNTER — Ambulatory Visit (INDEPENDENT_AMBULATORY_CARE_PROVIDER_SITE_OTHER): Payer: Medicare Other | Admitting: Obstetrics and Gynecology

## 2016-08-27 ENCOUNTER — Encounter: Payer: Self-pay | Admitting: Obstetrics and Gynecology

## 2016-08-27 VITALS — BP 159/68 | HR 72 | Ht 59.0 in | Wt 118.9 lb

## 2016-08-27 DIAGNOSIS — N8110 Cystocele, unspecified: Secondary | ICD-10-CM | POA: Diagnosis not present

## 2016-08-27 DIAGNOSIS — Z9289 Personal history of other medical treatment: Secondary | ICD-10-CM | POA: Diagnosis not present

## 2016-08-27 DIAGNOSIS — N3945 Continuous leakage: Secondary | ICD-10-CM | POA: Diagnosis not present

## 2016-08-27 DIAGNOSIS — IMO0002 Reserved for concepts with insufficient information to code with codable children: Secondary | ICD-10-CM

## 2016-08-27 DIAGNOSIS — Z960 Presence of urogenital implants: Secondary | ICD-10-CM

## 2016-08-27 MED ORDER — TROSPIUM CHLORIDE 20 MG PO TABS: 20.0000 mg | ORAL_TABLET | Freq: Every day | ORAL | 1 refills | Status: DC

## 2016-08-28 NOTE — Progress Notes (Signed)
    GYNECOLOGY PROGRESS NOTE  Subjective:    Patient ID: Amber RuedMargaret L Halpin, female    DOB: [birth year], 82 y.o.   MRN: [id number]  HPI  Patient is a 82 y.o. female who presents for pessary check.  She reports no vaginal bleeding or discharge. She denies pelvic discomfort and difficulty urinating or moving her bowels.  Patient's continues to note that she is still  having episodes of leakage despite activity or urge.    The following portions of the patient's history were reviewed and updated as appropriate: allergies, current medications, past family history, past medical history, past social history, past surgical history and problem list.  Review of Systems Pertinent items noted in HPI and remainder of comprehensive ROS otherwise negative.   Objective:   Blood pressure (!) 159/68, pulse 72, height 4\' 11"  (1.499 m), weight 118 lb 14.4 oz (53.9 kg). General appearance: alert and no distress Pelvic:  ?The patient's Size 2 ring with support pessary was removed, cleaned and replaced without complications. Speculum examination revealed normal vaginal mucosa with no lesions or lacerations.   Assessment:   Vaginal pessary in situ Continuous leakage Cystocele  Plan:   - Patient's daughter notes that patient never started medication Philis Nettle(Sanctura) as prescribed in prior visit.  Patient notes pharmacist told her that she shouldn't take it (reporting side effects).  I discussed side effects with patient. Noted that I have attempted to prescribe different medications in the past, it was not on the formulary of her insurance. She can try this medication first, and then if it doesn't work, we can appeal for a different medication to be covered by her insurance.  - The patient should return in 6 weeks  for a pessary check and continue to use vaginal estrogen cream weekly as prescribed. Previously tried pessary with knob but noted that it made daytime leaking worse.       Hildred LaserAnika Dietra Stokely,  MD [facility]

## 2016-10-06 ENCOUNTER — Ambulatory Visit (INDEPENDENT_AMBULATORY_CARE_PROVIDER_SITE_OTHER): Payer: Medicare Other | Admitting: Obstetrics and Gynecology

## 2016-10-06 ENCOUNTER — Encounter: Payer: Self-pay | Admitting: Obstetrics and Gynecology

## 2016-10-06 VITALS — BP 127/62 | HR 75 | Ht 59.0 in | Wt 117.8 lb

## 2016-10-06 DIAGNOSIS — N8111 Cystocele, midline: Secondary | ICD-10-CM | POA: Diagnosis not present

## 2016-10-06 DIAGNOSIS — N3945 Continuous leakage: Secondary | ICD-10-CM | POA: Diagnosis not present

## 2016-10-06 DIAGNOSIS — Z4689 Encounter for fitting and adjustment of other specified devices: Secondary | ICD-10-CM

## 2016-10-06 MED ORDER — TROSPIUM CHLORIDE 20 MG PO TABS: 20.0000 mg | ORAL_TABLET | Freq: Two times a day (BID) | ORAL | 3 refills | Status: DC

## 2016-10-06 NOTE — Progress Notes (Signed)
    GYNECOLOGY PROGRESS NOTE  Subjective:    Patient ID: Amber Holland, female    DOB: 03-21-1920, 82 y.o.   MRN: [id number] Urinary Frequency   Associated symptoms include frequency.   Patient is a 82 y.o. female who presents for pessary check.  She reports no vaginal bleeding or discharge. She denies pelvic discomfort and difficulty urinating or moving her bowels.  Patient's continues to note that she is still  having episodes of continuous leakage (mostly at night) when trying to get to the restroom. States that she gets up to void ~ 2-4 times at night.  Is taking the Sanctura as prescribed but not noticing much of a difference.   The following portions of the patient's history were reviewed and updated as appropriate: allergies, current medications, past family history, past medical history, past social history, past surgical history and problem list.  Review of Systems Pertinent items noted in HPI and remainder of comprehensive ROS otherwise negative.   Objective:   Blood pressure 127/62, pulse 75, height 4\' 11"  (1.499 m), weight 117 lb 12.8 oz (53.4 kg). General appearance: alert and no distress Pelvic:  ?The patient's Size 2 ring with support pessary was removed, cleaned and replaced without complications. Speculum examination revealed normal vaginal mucosa with no lesions or lacerations.   Assessment:   Vaginal pessary in situ Continuous leakage Cystocele  Plan:   - Will increase dose of Sanctura to BID.  Due to insurance, a formulary medication needs to be tried, and then if it doesn't work, we can appeal for a different medication to be covered by her insurance.  - The patient should return in 6-8 weeks  for a pessary check andre-evaluation of symptoms.  Continue to use vaginal estrogen cream weekly as prescribed. May be able to re-try pessary with knob.   Patient informed to bring old pessary back next visit.      Hildred LaserAnika Maurizio Geno, MD [facility]

## 2016-10-08 ENCOUNTER — Ambulatory Visit: Payer: Medicare Other | Admitting: Obstetrics and Gynecology

## 2016-11-17 ENCOUNTER — Ambulatory Visit (INDEPENDENT_AMBULATORY_CARE_PROVIDER_SITE_OTHER): Payer: Medicare Other | Admitting: Obstetrics and Gynecology

## 2016-11-17 ENCOUNTER — Encounter: Payer: Self-pay | Admitting: Obstetrics and Gynecology

## 2016-11-17 VITALS — BP 131/58 | HR 73 | Ht 59.0 in | Wt 115.9 lb

## 2016-11-17 DIAGNOSIS — N3944 Nocturnal enuresis: Secondary | ICD-10-CM | POA: Diagnosis not present

## 2016-11-17 DIAGNOSIS — R351 Nocturia: Secondary | ICD-10-CM

## 2016-11-17 DIAGNOSIS — N8111 Cystocele, midline: Secondary | ICD-10-CM | POA: Diagnosis not present

## 2016-11-17 DIAGNOSIS — Z4689 Encounter for fitting and adjustment of other specified devices: Secondary | ICD-10-CM

## 2016-11-17 DIAGNOSIS — R350 Frequency of micturition: Secondary | ICD-10-CM | POA: Diagnosis not present

## 2016-11-17 NOTE — Progress Notes (Signed)
    GYNECOLOGY PROGRESS NOTE  Subjective:    Patient ID: Amber Holland, female    DOB: 12-07-1919, 82 y.o.   MRN: [id number] Urinary Frequency   Associated symptoms include frequency.   Patient is a 82 y.o. female who presents for pessary check.  She reports no vaginal bleeding or discharge. She denies pelvic discomfort and difficulty urinating or moving her bowels.  Patient's continues to note that she is still  having episodes of leakage (mostly at night) when trying to get to the restroom. States that she gets up to void ~ 2-3 times at night.  Is taking the Sanctura as prescribed, is helping some.   The following portions of the patient's history were reviewed and updated as appropriate: allergies, current medications, past family history, past medical history, past social history, past surgical history and problem list.  Review of Systems Pertinent items noted in HPI and remainder of comprehensive ROS otherwise negative.   Objective:   Blood pressure (!) 131/58, pulse 73, height 4\' 11"  (1.499 m), weight 115 lb 14.4 oz (52.6 kg). General appearance: alert and no distress Pelvic:  ?The patient's Size 2 ring with support pessary was removed, cleaned and replaced without complications. Speculum examination revealed normal vaginal mucosa with no lesions or lacerations.   Assessment:   Vaginal pessary in situ Nocturia with continuous leakage Cystocele  Plan:   - Currently on Sanctura BID. Denies any side effects.  Will continue.  - Patient desires to have short trial without the pessary.  Will allow patient several weeks without pessary (3-4).  Will replace sooner if symptoms worsen.  - RTC in 3-4 weeks. Also instructed to bring previous knob with ring pessary from home.     Hildred LaserAnika Lourine Alberico, MD [facility]

## 2016-11-18 ENCOUNTER — Encounter: Payer: Medicare Other | Admitting: Obstetrics and Gynecology

## 2016-11-19 MED ORDER — TROSPIUM CHLORIDE 20 MG PO TABS: 20.0000 mg | ORAL_TABLET | Freq: Two times a day (BID) | ORAL | 11 refills | Status: DC

## 2016-12-14 ENCOUNTER — Telehealth: Payer: Self-pay | Admitting: Obstetrics and Gynecology

## 2016-12-14 DIAGNOSIS — R350 Frequency of micturition: Secondary | ICD-10-CM

## 2016-12-14 MED ORDER — TROSPIUM CHLORIDE 20 MG PO TABS: 20.0000 mg | ORAL_TABLET | Freq: Two times a day (BID) | ORAL | 3 refills | Status: DC

## 2016-12-14 NOTE — Telephone Encounter (Signed)
Pts daughter called and stated that the pts trospium chloride RX needs to be written for a 90 day supply [facility].

## 2016-12-14 NOTE — Telephone Encounter (Signed)
RX sent in for 180 tablets (3 month supply) [facility].

## 2017-01-12 ENCOUNTER — Ambulatory Visit (INDEPENDENT_AMBULATORY_CARE_PROVIDER_SITE_OTHER): Payer: Medicare Other | Admitting: Obstetrics and Gynecology

## 2017-01-12 ENCOUNTER — Encounter: Payer: Self-pay | Admitting: Obstetrics and Gynecology

## 2017-01-12 VITALS — BP 153/62 | HR 72 | Ht 59.0 in | Wt 115.0 lb

## 2017-01-12 DIAGNOSIS — R351 Nocturia: Secondary | ICD-10-CM | POA: Diagnosis not present

## 2017-01-12 DIAGNOSIS — N3945 Continuous leakage: Secondary | ICD-10-CM

## 2017-01-12 DIAGNOSIS — Z4689 Encounter for fitting and adjustment of other specified devices: Secondary | ICD-10-CM | POA: Diagnosis not present

## 2017-01-12 DIAGNOSIS — N8111 Cystocele, midline: Secondary | ICD-10-CM

## 2017-01-12 NOTE — Progress Notes (Signed)
    GYNECOLOGY PROGRESS NOTE  Subjective:    Patient ID: Maryan RuedMargaret L Tregre, female    DOB: 1920/10/14, 83 y.o.   MRN: [id number] Urinary Frequency     Patient is a 83 y.o. female who presents for pessary check.  She reports no vaginal bleeding or discharge. She denies pelvic discomfort and difficulty urinating or moving her bowels.  Patient's continues to note that she is still having episodes of leakage (mostly at night maybe 1-2 times) when trying to get to get up the restroom.  Is taking the Sanctura as prescribed.  Patient notes that she forgot to bring her other pessary to see if it would work better for her.   The following portions of the patient's history were reviewed and updated as appropriate: allergies, current medications, past family history, past medical history, past social history, past surgical history and problem list.  Review of Systems Pertinent items noted in HPI and remainder of comprehensive ROS otherwise negative.   Objective:   Blood pressure (!) 153/62, pulse 72, height 4\' 11"  (1.499 m), weight 115 lb (52.2 kg). General appearance: alert and no distress Pelvic:  ?The patient's Size 2 ring with support pessary was removed, cleaned and replaced without complications. Speculum examination revealed normal vaginal mucosa with no lesions or lacerations.   Assessment:   Vaginal pessary in situ Nocturia with continuous leakage Cystocele   Plan:   - Currently on Sanctura BID. Denies any side effects.  Will continue.  - Nocturia with leakage noted mostly when patient trying to change positions to get up and ambulate.  Encouraged patient to empty bladder at night before bed, once during the night, and first thing in the morning and try to develop a schedule for this.  Also noted that she may have some residual leakage due to not having knob in place from pessary.  Last time pessary caused discomfort with bowels.  Patient notes that she would like another trial with the  old pessary, but is unsure of where she placed. It.  Patient to return in 8-10 weeks for next pessary check. Should give patient enough time to locate other pessary.   Instructed to bring previous knob with ring pessary from home.     Hildred LaserAnika Laiylah Roettger, MD [facility]

## 2017-01-12 NOTE — Patient Instructions (Signed)
Biotene mouth wash for dry mouth

## 2017-04-07 ENCOUNTER — Encounter: Payer: Self-pay | Admitting: Obstetrics and Gynecology

## 2017-04-07 ENCOUNTER — Ambulatory Visit: Payer: Medicare Other | Admitting: Obstetrics and Gynecology

## 2017-04-09 ENCOUNTER — Encounter: Payer: Medicare Other | Admitting: Obstetrics and Gynecology

## 2017-04-13 ENCOUNTER — Ambulatory Visit (INDEPENDENT_AMBULATORY_CARE_PROVIDER_SITE_OTHER): Payer: Medicare Other | Admitting: Obstetrics and Gynecology

## 2017-04-13 VITALS — BP 126/61 | HR 82 | Ht 59.0 in | Wt 113.0 lb

## 2017-04-13 DIAGNOSIS — Z4689 Encounter for fitting and adjustment of other specified devices: Secondary | ICD-10-CM

## 2017-04-13 DIAGNOSIS — N8110 Cystocele, unspecified: Secondary | ICD-10-CM

## 2017-04-13 DIAGNOSIS — R351 Nocturia: Secondary | ICD-10-CM | POA: Diagnosis not present

## 2017-04-13 NOTE — Progress Notes (Signed)
    GYNECOLOGY PROGRESS NOTE  Subjective:    Patient ID: Amber Holland, female    DOB: 1920/08/17, 83 y.o.   MRN: [id number]   Patient is a 83 y.o. female who presents for pessary check.  She reports no vaginal bleeding or discharge. She denies pelvic discomfort and difficulty urinating or moving her bowels. Patient notes an increase in nocturia episodes over the past 2 days, however notes that she has been eating a lot of watermelon and thinks this is the problem so she is going to stop.   Is taking the Sanctura as prescribed.  Patient notes that she could not find her other pessary to see if it would work better for her.   The following portions of the patient's history were reviewed and updated as appropriate: allergies, current medications, past family history, past medical history, past social history, past surgical history and problem list.  Review of Systems Pertinent items noted in HPI and remainder of comprehensive ROS otherwise negative.   Objective:  Blood pressure 126/61, pulse 82, height 4\' 11"  (1.499 m), weight 113 lb (51.3 kg). General appearance: alert and no distress Pelvic:  ?The patient's Size 2 ring with support pessary was removed, cleaned and replaced without complications. Speculum examination revealed normal vaginal mucosa with no lesions or lacerations.   Assessment:   Vaginal pessary in situ Nocturia with continuous leakage Cystocele   Plan:   - Currently on Sanctura. Denies any side effects.  Will continue.  -   Patient to return in 8-10 weeks for next pessary check. Continue with Trimo-San gel as recommmended   Hildred Laserherry, Ayleen Mckinstry, MD [facility]

## 2017-05-11 ENCOUNTER — Telehealth: Payer: Self-pay | Admitting: Obstetrics and Gynecology

## 2017-05-11 DIAGNOSIS — R350 Frequency of micturition: Secondary | ICD-10-CM

## 2017-05-11 MED ORDER — TROSPIUM CHLORIDE 20 MG PO TABS: 20.0000 mg | ORAL_TABLET | Freq: Two times a day (BID) | ORAL | 3 refills | Status: DC

## 2017-05-11 NOTE — Telephone Encounter (Signed)
RX sent

## 2017-05-11 NOTE — Telephone Encounter (Signed)
Patient called requesting a script for a medication to make her stop urinating. She stated Dr Valentino Saxonherry has given it to her before. Thanks

## 2017-05-17 ENCOUNTER — Telehealth: Payer: Self-pay | Admitting: Obstetrics and Gynecology

## 2017-05-17 NOTE — Telephone Encounter (Signed)
Patient called wanting to speak with "Edson Snowballki, or Dr. Valentino Saxonherry." To get a medication filled for frequent urination before flight on July the 9th. Thank you.

## 2017-05-19 ENCOUNTER — Telehealth: Payer: Self-pay | Admitting: Obstetrics and Gynecology

## 2017-05-19 NOTE — Telephone Encounter (Signed)
Error

## 2017-05-19 NOTE — Telephone Encounter (Signed)
Called pt's daughter, she states that going forward we need to speak with her for any issues as pt has been calling for refills and already has several.

## 2017-05-19 NOTE — Telephone Encounter (Signed)
Please call patient concerning her medication

## 2017-07-14 ENCOUNTER — Encounter: Payer: Medicare Other | Admitting: Obstetrics and Gynecology

## 2017-08-24 ENCOUNTER — Ambulatory Visit (INDEPENDENT_AMBULATORY_CARE_PROVIDER_SITE_OTHER): Payer: Medicare Other | Admitting: Obstetrics and Gynecology

## 2017-08-24 ENCOUNTER — Encounter: Payer: Self-pay | Admitting: Obstetrics and Gynecology

## 2017-08-24 VITALS — BP 135/58 | HR 69 | Ht 59.0 in | Wt 112.9 lb

## 2017-08-24 DIAGNOSIS — N8110 Cystocele, unspecified: Secondary | ICD-10-CM | POA: Diagnosis not present

## 2017-08-24 DIAGNOSIS — Z4689 Encounter for fitting and adjustment of other specified devices: Secondary | ICD-10-CM

## 2017-08-24 DIAGNOSIS — R351 Nocturia: Secondary | ICD-10-CM | POA: Diagnosis not present

## 2017-08-24 NOTE — Progress Notes (Signed)
    GYNECOLOGY PROGRESS NOTE  Subjective:    Patient ID: Amber Holland, female    DOB: December 21, 1919, 83 y.o.   MRN: [id number]   Patient is a 83 y.o. female who presents for pessary check.  She reports no vaginal bleeding or discharge. She denies pelvic discomfort and difficulty urinating.  She does note occasionally having difficulty moving her bowels, however this has been ongoing for years. Notes she usually "takes a pill" to help it.  Is taking the Sanctura as prescribed.    The following portions of the patient's history were reviewed and updated as appropriate: allergies, current medications, past family history, past medical history, past social history, past surgical history and problem list.  Review of Systems Pertinent items noted in HPI and remainder of comprehensive ROS otherwise negative.   Objective:  Blood pressure (!) 135/58, pulse 69, height  (1.499 m), weight 112 lb 14.4 oz (51.2 kg). General appearance: alert and no distress Pelvic:  ?The patient's Size 2 ring with support pessary was removed, cleaned and replaced without complications. Speculum examination revealed normal vaginal mucosa with no lesions or lacerations.   Assessment:   Pessary maintenance Nocturia with continuous leakage (has improved) Cystocele   Plan:   - Currently on Sanctura. Denies any side effects.  Will continue.  -   Patient to return in 8-10 weeks for next pessary check. Continue with Trimo-San gel as recommmended   Hildred Laser, MD [facility]

## 2017-09-28 ENCOUNTER — Encounter: Payer: Medicare Other | Admitting: Obstetrics and Gynecology

## 2017-10-06 ENCOUNTER — Emergency Department
Admission: EM | Admit: 2017-10-06 | Discharge: 2017-10-06 | Disposition: A | Discharge status: Home / Self Care | Payer: Medicare Other | Attending: Emergency Medicine | Admitting: Emergency Medicine

## 2017-10-06 ENCOUNTER — Emergency Department: Payer: Medicare Other

## 2017-10-06 ENCOUNTER — Other Ambulatory Visit: Payer: Self-pay

## 2017-10-06 ENCOUNTER — Encounter: Payer: Self-pay | Admitting: Emergency Medicine

## 2017-10-06 DIAGNOSIS — I480 Paroxysmal atrial fibrillation: Secondary | ICD-10-CM | POA: Insufficient documentation

## 2017-10-06 DIAGNOSIS — I10 Essential (primary) hypertension: Secondary | ICD-10-CM | POA: Insufficient documentation

## 2017-10-06 DIAGNOSIS — Z7982 Long term (current) use of aspirin: Secondary | ICD-10-CM | POA: Insufficient documentation

## 2017-10-06 DIAGNOSIS — Z79899 Other long term (current) drug therapy: Secondary | ICD-10-CM | POA: Diagnosis not present

## 2017-10-06 DIAGNOSIS — R079 Chest pain, unspecified: Secondary | ICD-10-CM | POA: Diagnosis present

## 2017-10-06 LAB — BASIC METABOLIC PANEL
ANION GAP: 6 (ref 5–15)
BUN: 17 mg/dL (ref 6–20)
CALCIUM: 8.5 mg/dL — AB (ref 8.9–10.3)
CO2: 27 mmol/L (ref 22–32)
CREATININE: 0.73 mg/dL (ref 0.44–1.00)
Chloride: 105 mmol/L (ref 101–111)
Glucose, Bld: 98 mg/dL (ref 65–99)
Potassium: 3.7 mmol/L (ref 3.5–5.1)
Sodium: 138 mmol/L (ref 135–145)

## 2017-10-06 LAB — CBC
HCT: 37.6 % (ref 35.0–47.0)
HEMOGLOBIN: 12.3 g/dL (ref 12.0–16.0)
MCH: 29.2 pg (ref 26.0–34.0)
MCHC: 32.6 g/dL (ref 32.0–36.0)
MCV: 89.4 fL (ref 80.0–100.0)
PLATELETS: 323 10*3/uL (ref 150–440)
RBC: 4.21 MIL/uL (ref 3.80–5.20)
RDW: 14.9 % — ABNORMAL HIGH (ref 11.5–14.5)
WBC: 5.3 10*3/uL (ref 3.6–11.0)

## 2017-10-06 LAB — TROPONIN I

## 2017-10-06 NOTE — ED Notes (Signed)
Patient transported to radiology

## 2017-10-06 NOTE — ED Triage Notes (Signed)
[facility] Per EMS pt was having chest discomfort this morning that resolved prior to EMS arrival. EMS reports pt in A-fib RVR with intermittent runs of V-tach, longest lasting approx 6 seconds with EMS. Pt received approx 250cc's fluid with EMS, pt's HR currently 76, EMS reports pt had all of her home meds between 0715-0730 this AM. Pt is alert and oriented on arrival, NAD noted at this time.

## 2017-10-06 NOTE — ED Provider Notes (Signed)
[facility] Emergency Department Provider Note  Time seen: 9:22 AM  I have reviewed the triage vital signs and the nursing notes.   HISTORY  Chief Complaint Chest Pain and Palpitations    HPI Amber Holland is a 83 y.o. female with a past medical history of atrial fibrillation, constipation, gastric reflux, presents to the emergency department with a rapid heart rate.  According to the patient this morning after awakening she felt like her heart was racing and occasionally skipping beats.  EMS state upon their arrival patient's heart rate was fairly rapid around 170 bpm at times appeared to be atrial fibrillation.  Patient denies any chest pain or trouble breathing at any point.  Patient states this occasionally happens when her heart begins to race but usually it goes away within several minutes however this time it was not going away so the patient called EMS.  EMS has a rhythm strip showing what appears to be atrial fibrillation around 150-170 bpm, upon arrival to the emergency department the patient appears to have converted back to a normal sinus rhythm in the 70s.  Patient has no complaints at this time.  Patient states she feels well and is ready to go home.   Past Medical History:  Diagnosis Date  . AF (atrial fibrillation) (HCC)   . Arthritis   . Constipation   . Cystocele   . GERD (gastroesophageal reflux disease)   . Hypertension   . Nocturia   . Osteoporosis   . Urethral caruncle   . Urinary incontinence   . Vaginal atrophy     Patient Active Problem List   Diagnosis Date Noted  . Vaginal pessary in situ 03/16/2016  . Urinary incontinence 08/27/2015  . Cystocele, midline 08/27/2015  . Carpal tunnel syndrome 01/30/2015  . B12 deficiency 12/25/2014  . Hereditary and idiopathic neuropathy 12/25/2014  . A-fib (HCC) 05/09/2014  . Benign essential HTN 05/09/2014    Past Surgical History:  Procedure Laterality Date  . CARPAL TUNNEL  RELEASE    . NO PAST SURGERIES      Prior to Admission medications   Medication Sig Start Date End Date Taking? Authorizing Provider  acetaminophen (TYLENOL) 325 MG tablet Take 650 mg by mouth.    [provider]  amLODipine (NORVASC) 5 MG tablet TAKE ONE-HALF (1/2) TABLET DAILY 11/30/16   [provider]  aspirin EC 81 MG tablet Take by mouth.    [provider]  B Complex Vitamins (B COMPLEX PO) Take by mouth.    [provider]  candesartan (ATACAND) 32 MG tablet TAKE 1 TABLET DAILY 11/20/14   [provider]  celecoxib (CELEBREX) 200 MG capsule  10/15/15   [provider]  Cyanocobalamin (VITAMIN B-12 PO) Take by mouth.    [provider]  diclofenac sodium (VOLTAREN) 1 % GEL Apply topically. 03/23/17   [provider]  erythromycin ophthalmic ointment apply A SMALL AMOUNT TO LASH LINE 2-3 TIMES DAILY for 14 days 12/02/16   [provider]  ferrous sulfate 325 (65 FE) MG tablet Take by mouth.    [provider]  hydrochlorothiazide (HYDRODIURIL) 12.5 MG tablet  11/08/16   [provider]  HYDROcodone-acetaminophen (NORCO/VICODIN) 5-325 MG tablet 1/2 3-4 times a day 09/17/16   [provider]  hydrOXYzine (ATARAX/VISTARIL) 25 MG tablet  11/16/16   [provider]  Investigational vitamin D 600 UNITS capsule SWOG [id number] Take 600 Units by mouth daily. Take with food.  [provider]  neomycin-polymyxin b-dexamethasone (MAXITROL) 3.5-10000-0.1 OINT  02/16/17   [provider]  omeprazole (PRILOSEC) 40 MG capsule TAKE 1 CAPSULE DAILY 02/18/15   [provider]  propafenone (RYTHMOL) 225 MG tablet TAKE 1 TABLET BY MOUTH TWICE A DAY 03/04/15   [provider]  raloxifene (EVISTA) 60 MG tablet TAKE 1 TABLET DAILY 05/08/15   [provider]  senna (SENOKOT) 8.6 MG tablet Take 1 tablet by mouth daily.    [provider]  trospium  (SANCTURA) 20 MG tablet Take 1 tablet (20 mg total) by mouth 2 (two) times daily. 05/11/17   Hildred Laser, MD    Allergies  Allergen Reactions  . Gabapentin Other (See Comments)    Pt states that she felt weak, trembling and lightheaded    Family History  Problem Relation Age of Onset  . Cancer Neg Hx   . Diabetes Neg Hx   . Heart disease Neg Hx     Social History Social History   Tobacco Use  . Smoking status: Never Smoker  . Smokeless tobacco: Never Used  Substance Use Topics  . Alcohol use: Yes    Comment: occas  . Drug use: No    Review of Systems Constitutional: Negative for fever. Cardiovascular: Negative for chest pain.  Rapid heart rate, now resolved Respiratory: Negative for shortness of breath. Gastrointestinal: Negative for abdominal pain Musculoskeletal: Negative for leg swelling All other ROS negative  ____________________________________________   PHYSICAL EXAM:  VITAL SIGNS: ED Triage Vitals  Enc Vitals Group     BP 10/06/17 0843 133/79     Pulse Rate 10/06/17 0843 77     Resp 10/06/17 0843 16     Temp 10/06/17 0843 97.9 F (36.6 C)     Temp Source 10/06/17 0843 Oral     SpO2 10/06/17 0843 97 %     Weight 10/06/17 0838 115 lb (52.2 kg)     Height 10/06/17 0838 5\' 2"  (1.575 m)     Head Circumference --      Peak Flow --      Pain Score 10/06/17 0859 0     Pain Loc --      Pain Edu? --      Excl. in GC? --    Constitutional: Alert and oriented. Well appearing and in no distress.  Appears younger than stated age. Eyes: Normal exam ENT   Head: Normocephalic and atraumatic.   Mouth/Throat: Mucous membranes are moist. Cardiovascular: Normal rate, regular rhythm.  Respiratory: Normal respiratory effort without tachypnea nor retractions. Breath sounds are clear Gastrointestinal: Soft and nontender. No distention.  Musculoskeletal: Nontender with normal range of motion in all extremities.  No lower extremity edema or  tenderness. Neurologic:  Normal speech and language. No gross focal neurologic deficits  Skin:  Skin is warm, dry and intact.  Psychiatric: Mood and affect are normal.   ____________________________________________    EKG  EKG reviewed and interpreted by myself shows normal sinus rhythm at 77 bpm with a narrow QRS, normal axis, normal intervals besides slight PR prolongation, nonspecific ST changes.  No ST elevation.  ____________________________________________    RADIOLOGY  X-ray does not show any acute findings.  ____________________________________________   INITIAL IMPRESSION / ASSESSMENT AND PLAN / ED COURSE  Pertinent labs & imaging results that were available during my care of the patient were reviewed by me and considered in my medical decision making (see chart for details).  Patient presents to the emergency department  for rapid heart rate which has since resolved.  Differential would include sinus tachycardia, SVT, atrial fibrillation with RVR, V. tach, palpitations, anxiety, ACS.  We will check labs including cardiac enzymes, chest x-ray and continue to closely monitor.  Patient's EKG currently shows normal sinus rhythm in the 70s.  Patient denies any complaints at this time.  Patient's labs have resulted which are largely normal including a negative troponin. I have reviewed the patient's records although they are largely noncontributory to today's visit. I reviewed the patient's cardiology records including a 2017 visit to Dr. Lady GaryFath showing a history of paroxysmal atrial fibrillation.  Which states she is not a candidate for anticoagulation.  Patient continues to appear very well in the emergency department remains a normal sinus rhythm at 69 bpm currently.  Has no symptoms.  Patient is x-ray is normal, labs are normal troponin is negative.  Patient is asking to be discharged home.  She has followed up with Dr. Lady GaryFath in the past I believe this is a reasonable plan of care  for the patient and she will follow-up with cardiology.  ____________________________________________   FINAL CLINICAL IMPRESSION(S) / ED DIAGNOSES  Atrial fibrillation with rapid ventricular response    Minna AntisPaduchowski, Shontel Santee, MD 10/06/17 1049

## 2017-11-10 ENCOUNTER — Ambulatory Visit (INDEPENDENT_AMBULATORY_CARE_PROVIDER_SITE_OTHER): Payer: Medicare Other | Admitting: Obstetrics and Gynecology

## 2017-11-10 ENCOUNTER — Encounter: Payer: Self-pay | Admitting: Obstetrics and Gynecology

## 2017-11-10 VITALS — BP 184/79 | HR 70 | Wt 113.0 lb

## 2017-11-10 DIAGNOSIS — N8111 Cystocele, midline: Secondary | ICD-10-CM | POA: Diagnosis not present

## 2017-11-10 DIAGNOSIS — Z960 Presence of urogenital implants: Secondary | ICD-10-CM

## 2017-11-10 DIAGNOSIS — N3945 Continuous leakage: Secondary | ICD-10-CM | POA: Diagnosis not present

## 2017-11-10 DIAGNOSIS — I10 Essential (primary) hypertension: Secondary | ICD-10-CM | POA: Diagnosis not present

## 2017-11-10 DIAGNOSIS — Z9289 Personal history of other medical treatment: Secondary | ICD-10-CM | POA: Diagnosis not present

## 2017-11-10 NOTE — Progress Notes (Signed)
Pt is here for a pessary check. 

## 2017-11-10 NOTE — Progress Notes (Signed)
    GYNECOLOGY PROGRESS NOTE  Subjective:    Patient ID: Maryan RuedMargaret L Filler, female    DOB: [birth year], 83 y.o.   MRN: [id number]   Patient is a 83 y.o. female who presents for pessary check.  She reports no vaginal bleeding or discharge. She denies pelvic discomfort and difficulty urinating.  Notes that she is still leaking some urine even with pessary in place. Is taking the Sanctura as prescribed.    The following portions of the patient's history were reviewed and updated as appropriate: allergies, current medications, past family history, past medical history, past social history, past surgical history and problem list.  Review of Systems Pertinent items noted in HPI and remainder of comprehensive ROS otherwise negative.   Objective:  Blood pressure (!) 184/79, pulse 70, weight 113 lb (51.3 kg). General appearance: alert and no distress Pelvic:  ?The patient's Size 2 ring with support pessary was removed, cleaned and replaced without complications. Speculum examination revealed normal vaginal mucosa with no lesions or lacerations.   Assessment:   Pessary maintenance Nocturia with continuous leakage (has improved) Cystocele  HTN (uncontrolled) Plan:   - Continue on Sanctura. Denies any side effects.   - Patient to return in 10-12 weeks for next pessary check. Continue with Trimo-San gel as recommmended - Hypertension currently uncontrolled.  Patient reports compliance with medications. Denies symptoms today.   Hildred Laserherry, Miraya Cudney, MD [facility]

## 2018-02-02 ENCOUNTER — Encounter: Payer: Medicare Other | Admitting: Obstetrics and Gynecology

## 2018-02-09 ENCOUNTER — Encounter: Payer: Medicare Other | Admitting: Obstetrics and Gynecology

## 2018-02-16 ENCOUNTER — Ambulatory Visit (INDEPENDENT_AMBULATORY_CARE_PROVIDER_SITE_OTHER): Payer: Medicare Other | Admitting: Obstetrics and Gynecology

## 2018-02-16 ENCOUNTER — Encounter: Payer: Self-pay | Admitting: Obstetrics and Gynecology

## 2018-02-16 VITALS — BP 143/66 | HR 70 | Wt 114.8 lb

## 2018-02-16 DIAGNOSIS — I10 Essential (primary) hypertension: Secondary | ICD-10-CM

## 2018-02-16 DIAGNOSIS — R682 Dry mouth, unspecified: Secondary | ICD-10-CM | POA: Diagnosis not present

## 2018-02-16 DIAGNOSIS — R351 Nocturia: Secondary | ICD-10-CM

## 2018-02-16 DIAGNOSIS — Z4689 Encounter for fitting and adjustment of other specified devices: Secondary | ICD-10-CM | POA: Diagnosis not present

## 2018-02-16 DIAGNOSIS — N8110 Cystocele, unspecified: Secondary | ICD-10-CM | POA: Diagnosis not present

## 2018-02-16 MED ORDER — MIRABEGRON ER 25 MG PO TB24: 25.0000 mg | ORAL_TABLET | Freq: Every day | ORAL | 3 refills | Status: DC

## 2018-02-16 NOTE — Progress Notes (Signed)
    GYNECOLOGY PROGRESS NOTE  Subjective:    Patient ID: Amber Holland, female    DOB: [birth year], 84 y.o.   MRN: [id number]   Patient is a 84 y.o. female who presents for pessary check.  She reports no vaginal bleeding or discharge. She denies pelvic discomfort and difficulty urinating.  She continues to complain of nocturia.  Her daughter reports that she is dry during the day.  She does void at night prior to bed, and limits fluid intake.  Patient notes she has also cut out coffee. Is taking the Sanctura as prescribed, but notes moderate dry mouth in the evening.    The following portions of the patient's history were reviewed and updated as appropriate: allergies, current medications, past family history, past medical history, past social history, past surgical history and problem list.  Review of Systems Pertinent items noted in HPI and remainder of comprehensive ROS otherwise negative.   Objective:  Blood pressure (!) 143/66, pulse 70, weight 114 lb 12.8 oz (52.1 kg). General appearance: alert and no distress Pelvic:  ?The patient's Size 2 ring with support pessary was removed, cleaned and replaced without complications. Speculum examination revealed normal vaginal mucosa with no lesions or lacerations.   Assessment:   Pessary maintenance Nocturia with continuous leakage Dry mouth Cystocele  HTN (uncontrolled)  Plan:   - Patient currently on Sanctura BID.  Was considering increasing dose in evening to help with nocturia, however patient notes that she already experiences moderate dry mouth.  Discussed changing to a different medication with less side effects.  She has now tried Information systems managerVesicare as well as Holiday representativeanctura (which did help some), but desires to change due to side effects. Will prescribe Myrbetric (attempted to prescribe in the past but was denied by insurance due to failure to try alternative regimens).  - Patient to return in 4 months for next pessary check. Also discussed  option of trying to re-fit for a different pessary (perhaps an incontinence dish with knob or Schaatz pessary) due to nocturia.  Patient would like to think about it.   - Continue with Trimo-San gel as recommmended - Hypertension currently better controlled.     Hildred Laserherry, Linzy Darling, MD [facility]

## 2018-02-16 NOTE — Patient Instructions (Signed)
Mirabegron extended-release tablets What is this medicine? MIRABEGRON (MIR a BEG ron) is used to treat overactive bladder. This medicine reduces the amount of bathroom visits. It may also help to control wetting accidents. This medicine may be used for other purposes; ask your health care provider or pharmacist if you have questions. COMMON BRAND NAME(S): Myrbetriq What should I tell my health care provider before I take this medicine? They need to know if you have any of these conditions: -difficulty passing urine -high blood pressure -kidney disease -liver disease -an unusual or allergic reaction to mirabegron, other medicines, foods, dyes, or preservatives -pregnant or trying to get pregnant -breast-feeding How should I use this medicine? Take this medicine by mouth with a glass of water. Follow the directions on the prescription label. Do not cut, crush or chew this medicine. You can take it with or without food. If it upsets your stomach, take it with food. Take your medicine at regular intervals. Do not take it more often than directed. Do not stop taking except on your doctor's advice. Talk to your pediatrician regarding the use of this medicine in children. Special care may be needed. Overdosage: If you think you have taken too much of this medicine contact a poison control center or emergency room at once. NOTE: This medicine is only for you. Do not share this medicine with others. What if I miss a dose? If you miss a dose, take it as soon as you can. If it is almost time for your next dose, take only that dose. Do not take double or extra doses. What may interact with this medicine? -certain medicines for bladder problems like fesoterodine, oxybutynin, solifenacin, tolterodine -desipramine -digoxin -flecainide -ketoconazole -MAOIs like Carbex, Eldepryl, Marplan, Nardil, and Parnate -metoprolol -propafenone -thioridazine -warfarin This list may not describe all possible  interactions. Give your health care provider a list of all the medicines, herbs, non-prescription drugs, or dietary supplements you use. Also tell them if you smoke, drink alcohol, or use illegal drugs. Some items may interact with your medicine. What should I watch for while using this medicine? It may take 8 weeks to notice the full benefit from this medicine. You may need to limit your intake tea, coffee, caffeinated sodas, and alcohol. These drinks may make your symptoms worse. Visit your doctor or health care professional for regular checks on your progress. Check your blood pressure as directed. Ask your doctor or health care professional what your blood pressure should be and when you should contact him or her. What side effects may I notice from receiving this medicine? Side effects that you should report to your doctor or health care professional as soon as possible: -allergic reactions like skin rash, itching or hives, swelling of the face, lips, or tongue -chest pain or palpitations -severe or sudden headache -high blood pressure -fast, irregular heartbeat -redness, blistering, peeling or loosening of the skin, including inside the mouth -signs of infection like fever or chills; cough; sore throat; pain or difficulty passing urine -trouble passing urine or change in the amount of urine Side effects that usually do not require medical attention (report to your doctor or health care professional if they continue or are bothersome): -constipation -diarrhea -dizziness -dry eyes -joint pain -mild headache -nausea -runny nose This list may not describe all possible side effects. Call your doctor for medical advice about side effects. You may report side effects to FDA at 1-800-FDA-1088. Where should I keep my medicine? Keep out of the reach   of children. Store at room temperature between 15 and 30 degrees C (59 and 86 degrees F). Throw away any unused medicine after the expiration  date. NOTE: This sheet is a summary. It may not cover all possible information. If you have questions about this medicine, talk to your doctor, pharmacist, or health care provider.  2018 [employer] Standard (2015-12-19 12:14:30)  

## 2018-02-16 NOTE — Progress Notes (Signed)
Pt stated that pessary is helping her.

## 2018-03-07 ENCOUNTER — Telehealth: Payer: Self-pay | Admitting: Obstetrics and Gynecology

## 2018-03-07 ENCOUNTER — Other Ambulatory Visit: Payer: Self-pay

## 2018-03-07 MED ORDER — MIRABEGRON ER 25 MG PO TB24: 25.0000 mg | ORAL_TABLET | Freq: Every day | ORAL | 3 refills | Status: DC

## 2018-03-07 NOTE — Addendum Note (Signed)
Addended by: Silvano BilisHAMPTON, Mariha Sleeper L on: 03/07/2018 11:18 AM   Modules accepted: Orders

## 2018-03-07 NOTE — Telephone Encounter (Signed)
The patient's daughter called and stated that she would like to have Dr. Oretha Milchherry's nurse give her a call in regards to the patients medication she currently has a 30 day script and she would prefer to have a 90 day supply. Please advise.

## 2018-03-07 NOTE — Telephone Encounter (Signed)
Spoke with pt's daughter and she wants a 90 day supply instead of a 30 day supply of myrbetriq prescription sent to [facility]

## 2018-03-07 NOTE — Telephone Encounter (Signed)
Spoke with pt's daughter she wants a 90 day supply of my[facility]riq [facility]. An escript was sent to the pharmacy.

## 2018-03-24 ENCOUNTER — Telehealth: Payer: Self-pay | Admitting: Obstetrics and Gynecology

## 2018-03-24 NOTE — Telephone Encounter (Signed)
The patients daughter called and stated that she would like to speak to Dr. Valentino Saxonherry or her nurse in regards to the patients medication. She would like a call back today. Please advise.

## 2018-03-25 ENCOUNTER — Other Ambulatory Visit: Payer: Self-pay | Admitting: Obstetrics and Gynecology

## 2018-03-25 ENCOUNTER — Telehealth: Payer: Self-pay | Admitting: Obstetrics and Gynecology

## 2018-03-25 DIAGNOSIS — R350 Frequency of micturition: Secondary | ICD-10-CM

## 2018-03-25 NOTE — Telephone Encounter (Signed)
[facility] called no answer [facility] and stop the new med and go back to the old one.

## 2018-03-25 NOTE — Telephone Encounter (Signed)
Pt was called and spoke with daughter she stated that her mother had used 60 pull-ups in 10 days and was wondering if she should wait to see if the medication Myrbetriq was going to work or should she just go back to the other medication she was taking. [facility]

## 2018-03-25 NOTE — Telephone Encounter (Signed)
Pt called back and was informed that she could stop the new medication and go back to the old one.

## 2018-03-25 NOTE — Telephone Encounter (Signed)
The patients daughter called and stated that she would like to speak with a nurse in regards to her needing to ask some questions in regards to her mothers medication today. Please advise.

## 2018-03-31 ENCOUNTER — Other Ambulatory Visit: Payer: Self-pay

## 2018-03-31 ENCOUNTER — Ambulatory Visit: Payer: Medicare Other | Attending: Nurse Practitioner | Admitting: Nurse Practitioner

## 2018-03-31 ENCOUNTER — Encounter: Payer: Self-pay | Admitting: Nurse Practitioner

## 2018-03-31 DIAGNOSIS — Z79899 Other long term (current) drug therapy: Secondary | ICD-10-CM | POA: Diagnosis not present

## 2018-03-31 DIAGNOSIS — G8929 Other chronic pain: Secondary | ICD-10-CM | POA: Insufficient documentation

## 2018-03-31 DIAGNOSIS — M25562 Pain in left knee: Secondary | ICD-10-CM

## 2018-03-31 DIAGNOSIS — R32 Unspecified urinary incontinence: Secondary | ICD-10-CM | POA: Insufficient documentation

## 2018-03-31 DIAGNOSIS — M899 Disorder of bone, unspecified: Secondary | ICD-10-CM

## 2018-03-31 DIAGNOSIS — Z7982 Long term (current) use of aspirin: Secondary | ICD-10-CM | POA: Diagnosis not present

## 2018-03-31 DIAGNOSIS — N952 Postmenopausal atrophic vaginitis: Secondary | ICD-10-CM | POA: Diagnosis not present

## 2018-03-31 DIAGNOSIS — E538 Deficiency of other specified B group vitamins: Secondary | ICD-10-CM | POA: Insufficient documentation

## 2018-03-31 DIAGNOSIS — N8111 Cystocele, midline: Secondary | ICD-10-CM | POA: Insufficient documentation

## 2018-03-31 DIAGNOSIS — G609 Hereditary and idiopathic neuropathy, unspecified: Secondary | ICD-10-CM | POA: Insufficient documentation

## 2018-03-31 DIAGNOSIS — I4891 Unspecified atrial fibrillation: Secondary | ICD-10-CM | POA: Insufficient documentation

## 2018-03-31 DIAGNOSIS — I10 Essential (primary) hypertension: Secondary | ICD-10-CM | POA: Insufficient documentation

## 2018-03-31 DIAGNOSIS — Z789 Other specified health status: Secondary | ICD-10-CM

## 2018-03-31 DIAGNOSIS — R351 Nocturia: Secondary | ICD-10-CM | POA: Diagnosis not present

## 2018-03-31 DIAGNOSIS — G894 Chronic pain syndrome: Secondary | ICD-10-CM | POA: Diagnosis not present

## 2018-03-31 DIAGNOSIS — Z5181 Encounter for therapeutic drug level monitoring: Secondary | ICD-10-CM | POA: Diagnosis not present

## 2018-03-31 DIAGNOSIS — G5603 Carpal tunnel syndrome, bilateral upper limbs: Secondary | ICD-10-CM | POA: Insufficient documentation

## 2018-03-31 DIAGNOSIS — M1712 Unilateral primary osteoarthritis, left knee: Secondary | ICD-10-CM | POA: Diagnosis not present

## 2018-03-31 DIAGNOSIS — K219 Gastro-esophageal reflux disease without esophagitis: Secondary | ICD-10-CM | POA: Insufficient documentation

## 2018-03-31 DIAGNOSIS — M810 Age-related osteoporosis without current pathological fracture: Secondary | ICD-10-CM | POA: Insufficient documentation

## 2018-03-31 DIAGNOSIS — G629 Polyneuropathy, unspecified: Secondary | ICD-10-CM | POA: Diagnosis not present

## 2018-03-31 NOTE — Progress Notes (Signed)
Patient's Name: Amber Holland  MRN: [id number]  Referring Provider: Renata Caprice  DOB: 12-31-1919  PCP: Adin Hector, MD  DOS: 03/31/2018  Note by: Dionisio David NP  Service setting: Ambulatory outpatient  Specialty: Interventional Pain Management  Location: [facility] (AMB) Pain Management Facility    Patient type: New Patient    Primary Reason(s) for Visit: Initial Patient Evaluation CC: Knee Pain (left)  HPI  Amber Holland is a 84 y.o. year old, female patient, who comes today for an initial evaluation. She has Atrial fibrillation ([place]); B12 deficiency; Bilateral carpal tunnel syndrome; Benign essential hypertension; Hereditary and idiopathic neuropathy; Urinary incontinence; Cystocele, midline; Vaginal pessary in situ; Peripheral polyneuropathy; Chronic knee pain (Primary Area of Pain) (Left); Chronic pain syndrome; Pharmacologic therapy; Disorder of skeletal system; Problems influencing health status; and Osteoarthritis of knee (Left) on their problem list.. Her primarily concern today is the Knee Pain (left)  Pain Assessment: Location: Left Knee Radiating: denies Onset: More than a month ago Duration: Chronic pain Quality: Aching Severity: 7 /10 (self-reported pain score)  Note: Reported level is compatible with observation. Clinically the patient looks like a 2/10 A 2/10 is viewed as "Mild to Moderate" and described as noticeable and distracting. Impossible to hide from other people. More frequent flare-ups. Still possible to adapt and function close to normal. It can be very annoying and may have occasional stronger flare-ups. With discipline, patients may get used to it and adapt. Information on the proper use of the pain scale provided to the patient today. When using our objective Pain Scale, levels between 6 and 10/10 are said to belong in an emergency room, as it progressively worsens from a 6/10, described as severely limiting, requiring emergency care not usually  available at an outpatient pain management facility. At a 6/10 level, communication becomes difficult and requires great effort. Assistance to reach the emergency department may be required. Facial flushing and profuse sweating along with potentially dangerous increases in heart rate and blood pressure will be evident. Effect on ADL:   Timing: Intermittent Modifying factors: rest  Onset and Duration: Gradual Cause of pain: Unknown Severity: Getting worse, NAS-11 at its worse: 7/10, NAS-11 at its best: 5/10, NAS-11 now: 8/10 and NAS-11 on the average: 5/10 Timing: Afternoon, Night and After activity or exercise Aggravating Factors: Walking, Walking uphill and Walking downhill Alleviating Factors: Resting Associated Problems: N/A Quality of Pain: Annoying Previous Examinations or Tests: X-rays Previous Treatments: synvisc X 3  The patient comes into the clinics today for the first time for a chronic pain management evaluation. According to the patient her primary area of pain is in her left knee. She denies any injury. She denies any pervious surgery.  She has had cortisone injection and Synvisc 3 series which was not effective. She has tired Physical therapy which was not effective. She had xray's of her left knee 04/2016.   She denies any other areas of pain.   Today I took the time to provide the patient with information regarding this pain practice. The patient was informed that the practice is divided into two sections: an interventional pain management section, as well as a completely separate and distinct medication management section. I explained that there are procedure days for interventional therapies, and evaluation days for follow-ups and medication management. Because of the amount of documentation required during both, they are kept separated. This means that there is the possibility that she may be scheduled for a procedure on one day,  and medication management the next. I have  also informed her that because of staffing and facility limitations, this practice will no longer take patients for medication management only. To illustrate the reasons for this, I gave the patient the example of surgeons, and how inappropriate it would be to refer a patient to his/her care, just to write for the post-surgical antibiotics on a surgery done by a different surgeon.   Because interventional pain management is part of the board-certified specialty for the doctors, the patient was informed that joining this practice means that they are open to any and all interventional therapies. I made it clear that this does not mean that they will be forced to have any procedures done. What this means is that I believe interventional therapies to be essential part of the diagnosis and proper management of chronic pain conditions. Therefore, patients not interested in these interventional alternatives will be better served under the care of a different practitioner.  The patient was also made aware of my Comprehensive Pain Management Safety Guidelines where by joining this practice, they limit all of their nerve blocks and joint injections to those done by our practice, for as long as we are retained to manage their care. Historic Controlled Substance Pharmacotherapy Review  PMP and historical list of controlled substances: None MME/day: 0 mg/day Medications: The patient did not bring the medication(s) to the appointment, as requested in our "New Patient Package" Pharmacodynamics: Desired effects: Analgesia: The patient reports >50% benefit. Reported improvement in function: The patient reports medication allows her to accomplish basic ADLs. Clinically meaningful improvement in function (CMIF): Sustained CMIF goals met Perceived effectiveness: Described as relatively effective, allowing for increase in activities of daily living (ADL) Undesirable effects: Side-effects or Adverse reactions: None  reported Historical Monitoring: The patient  reports that she does not use drugs. List of all UDS Test(s): No results found for: MDMA, COCAINSCRNUR, PCPSCRNUR, PCPQUANT, CANNABQUANT, THCU, [place] List of all Serum Drug Screening Test(s):  No results found for: AMPHSCRSER, BARBSCRSER, BENZOSCRSER, COCAINSCRSER, PCPSCRSER, PCPQUANT, THCSCRSER, CANNABQUANT, OPIATESCRSER, OXYSCRSER, PROPOXSCRSER Historical Background Evaluation: [place] PDMP: Six (6) year initial data search conducted.             [place] Department of public safety, offender search: Editor, commissioning Information) Non-contributory Risk Assessment Profile: Aberrant behavior: None observed or detected today Risk factors for fatal opioid overdose: signs of non-medical use of Opioids Fatal overdose hazard ratio (HR): Calculation deferred Non-fatal overdose hazard ratio (HR): Calculation deferred Risk of opioid abuse or dependence: 0.7-3.0% with doses ? 36 MME/day and 6.1-26% with doses ? 120 MME/day. Substance use disorder (SUD) risk level: Pending results of Medical Psychology Evaluation for SUD Opioid risk tool (ORT) (Total Score): 0  ORT Scoring interpretation table:  Score <3 = Low Risk for SUD  Score between 4-7 = Moderate Risk for SUD  Score >8 = High Risk for Opioid Abuse   PHQ-2 Depression Scale:  Total score: 0  PHQ-2 Scoring interpretation table: (Score and probability of major depressive disorder)  Score 0 = No depression  Score 1 = 15.4% Probability  Score 2 = 21.1% Probability  Score 3 = 38.4% Probability  Score 4 = 45.5% Probability  Score 5 = 56.4% Probability  Score 6 = 78.6% Probability   PHQ-9 Depression Scale:  Total score: 0  PHQ-9 Scoring interpretation table:  Score 0-4 = No depression  Score 5-9 = Mild depression  Score 10-14 = Moderate depression  Score 15-19 = Moderately severe depression  Score  20-27 = Severe depression (2.4 times higher risk of SUD and 2.89 times higher risk of overuse)   Pharmacologic Plan:  Pending ordered tests and/or consults  Meds  The patient has a current medication list which includes the following prescription(s): acetaminophen, amlodipine, aspirin ec, candesartan, celecoxib, cyanocobalamin, diclofenac sodium, omeprazole, propafenone, raloxifene, and trospium.  Current Outpatient Medications on File Prior to Visit  Medication Sig  . acetaminophen (TYLENOL) 325 MG tablet Take 650 mg by mouth.  Marland Kitchen amLODipine (NORVASC) 5 MG tablet TAKE ONE-HALF (1/2) TABLET DAILY  . aspirin EC 81 MG tablet Take by mouth.  . candesartan (ATACAND) 32 MG tablet TAKE 1 TABLET DAILY  . celecoxib (CELEBREX) 200 MG capsule   . Cyanocobalamin (VITAMIN B-12 PO) Take by mouth.  . diclofenac sodium (VOLTAREN) 1 % GEL Apply topically.  Marland Kitchen omeprazole (PRILOSEC) 40 MG capsule TAKE 1 CAPSULE DAILY  . propafenone (RYTHMOL) 225 MG tablet TAKE 1 TABLET BY MOUTH TWICE A DAY  . raloxifene (EVISTA) 60 MG tablet TAKE 1 TABLET DAILY  . trospium (SANCTURA) 20 MG tablet TAKE 1 TABLET TWICE A DAY   No current facility-administered medications on file prior to visit.    Imaging Review  Note: No results found under the [place] record.      Left knee xray in [place]  ROS  Cardiovascular History: Abnormal heart rhythm and Daily Aspirin intake Pulmonary or Respiratory History: No reported pulmonary signs or symptoms such as wheezing and difficulty taking a deep full breath (Asthma), difficulty blowing air out (Emphysema), coughing up mucus (Bronchitis), persistent dry cough, or temporary stoppage of breathing during sleep Neurological History: No reported neurological signs or symptoms such as seizures, abnormal skin sensations, urinary and/or fecal incontinence, being born with an abnormal open spine and/or a tethered spinal cord Review of Past Neurological Studies: No results found for this or any previous visit. Psychological-Psychiatric History: No reported psychological or  psychiatric signs or symptoms such as difficulty sleeping, anxiety, depression, delusions or hallucinations (schizophrenial), mood swings (bipolar disorders) or suicidal ideations or attempts Gastrointestinal History: No reported gastrointestinal signs or symptoms such as vomiting or evacuating blood, reflux, heartburn, alternating episodes of diarrhea and constipation, inflamed or scarred liver, or pancreas or irrregular and/or infrequent bowel movements Genitourinary History: No reported renal or genitourinary signs or symptoms such as difficulty voiding or producing urine, peeing blood, non-functioning kidney, kidney stones, difficulty emptying the bladder, difficulty controlling the flow of urine, or chronic kidney disease Hematological History: No reported hematological signs or symptoms such as prolonged bleeding, low or poor functioning platelets, bruising or bleeding easily, hereditary bleeding problems, low energy levels due to low hemoglobin or being anemic Endocrine History: No reported endocrine signs or symptoms such as high or low blood sugar, rapid heart rate due to high thyroid levels, obesity or weight gain due to slow thyroid or thyroid disease Rheumatologic History: No reported rheumatological signs and symptoms such as fatigue, joint pain, tenderness, swelling, redness, heat, stiffness, decreased range of motion, with or without associated rash Musculoskeletal History: Negative for myasthenia gravis, muscular dystrophy, multiple sclerosis or malignant hyperthermia Work History: Retired  Allergies  Amber Holland is allergic to gabapentin.  Laboratory Chemistry  Inflammation Markers Lab Results  Component Value Date   CRP 1.8 03/31/2018   ESRSEDRATE 10 03/31/2018   (CRP: Acute Phase) (ESR: Chronic Phase) Renal Function Markers Lab Results  Component Value Date   BUN 27 03/31/2018   CREATININE 0.66 03/31/2018   GFRAA 87  03/31/2018   GFRNONAA 75 03/31/2018   Hepatic  Function Markers Lab Results  Component Value Date   AST 9 03/31/2018   ALT 14 04/18/2014   ALBUMIN 3.8 03/31/2018   ALKPHOS 62 03/31/2018   Electrolytes Lab Results  Component Value Date   NA 143 03/31/2018   K 4.1 03/31/2018   CL 105 03/31/2018   CALCIUM 8.8 03/31/2018   MG 2.1 03/31/2018   Neuropathy Markers Lab Results  Component Value Date   VITAMINB12 1,684 (H) 03/31/2018   Bone Pathology Markers Lab Results  Component Value Date   ALKPHOS 62 03/31/2018   25OHVITD1 WILL FOLLOW 03/31/2018   25OHVITD2 WILL FOLLOW 03/31/2018   25OHVITD3 WILL FOLLOW 03/31/2018   CALCIUM 8.8 03/31/2018   Coagulation Parameters Lab Results  Component Value Date   PLT 323 10/06/2017   Cardiovascular Markers Lab Results  Component Value Date   HGB 12.3 10/06/2017   HCT 37.6 10/06/2017   Note: Lab results reviewed.  [place]  Drug: Amber Holland  reports that she does not use drugs. Alcohol:  reports that she drinks alcohol. Tobacco:  reports that she has never smoked. She has never used smokeless tobacco. Medical:  has a past medical history of AF (atrial fibrillation) ([place]), Arthritis, Constipation, Cystocele, GERD (gastroesophageal reflux disease), Hypertension, Nocturia, Osteoporosis, Urethral caruncle, Urinary incontinence, and Vaginal atrophy. Family: family history is not on file.  Past Surgical History:  Procedure Laterality Date  . CARPAL TUNNEL RELEASE    . NO PAST SURGERIES     Active Ambulatory Problems    Diagnosis Date Noted  . Atrial fibrillation ([place]) 05/09/2014  . B12 deficiency 12/25/2014  . Bilateral carpal tunnel syndrome 01/30/2015  . Benign essential hypertension 05/09/2014  . Hereditary and idiopathic neuropathy 12/25/2014  . Urinary incontinence 08/27/2015  . Cystocele, midline 08/27/2015  . Vaginal pessary in situ 03/16/2016  . Peripheral polyneuropathy 12/25/2014  . Chronic knee pain (Primary Area of Pain) (Left) 03/31/2018  . Chronic pain  syndrome 03/31/2018  . Pharmacologic therapy 03/31/2018  . Disorder of skeletal system 03/31/2018  . Problems influencing health status 03/31/2018  . Osteoarthritis of knee (Left) 04/03/2018   Resolved Ambulatory Problems    Diagnosis Date Noted  . No Resolved Ambulatory Problems   Past Medical History:  Diagnosis Date  . AF (atrial fibrillation) ([place])   . Arthritis   . Constipation   . Cystocele   . GERD (gastroesophageal reflux disease)   . Hypertension   . Nocturia   . Osteoporosis   . Urethral caruncle   . Urinary incontinence   . Vaginal atrophy    Constitutional Exam  General appearance: Well nourished, well developed, and well hydrated. In no apparent acute distress Vitals:   03/31/18 1307  BP: (!) 167/83  Pulse: 66  Resp: 16  Temp: 97.9 F (36.6 C)  TempSrc: Oral  SpO2: 97%  Weight: 110 lb (49.9 kg)  Height: '4\' 10"'$  (1.473 m)   BMI Assessment: Estimated body mass index is 22.99 kg/m as calculated from the following:   Height as of this encounter: '4\' 10"'$  (1.473 m).   Weight as of this encounter: 110 lb (49.9 kg).  BMI interpretation table: BMI level Category Range association with higher incidence of chronic pain  <18 kg/m2 Underweight   18.5-24.9 kg/m2 Ideal body weight   25-29.9 kg/m2 Overweight Increased incidence by 20%  30-34.9 kg/m2 Obese (Class I) Increased incidence by 68%  35-39.9 kg/m2 Severe obesity (Class II) Increased incidence by 136%  >  40 kg/m2 Extreme obesity (Class III) Increased incidence by 254%   BMI Readings from Last 4 Encounters:  03/31/18 22.99 kg/m  02/16/18 21.00 kg/m  11/10/17 20.67 kg/m  10/06/17 21.03 kg/m   Wt Readings from Last 4 Encounters:  03/31/18 110 lb (49.9 kg)  02/16/18 114 lb 12.8 oz (52.1 kg)  11/10/17 113 lb (51.3 kg)  10/06/17 115 lb (52.2 kg)  Psych/Mental status: Alert, oriented x 3 (person, place, & time)       Eyes: PERLA Respiratory: No evidence of acute respiratory distress   Gait & Posture  Assessment  Ambulation: Unassisted Gait: Relatively normal for age and body habitus Posture: WNL   Lower Extremity Exam    Side: Right lower extremity  Side: Left lower extremity  Inspection: No masses, redness, swelling, or asymmetry. No contractures  Inspection: Edema  Functional ROM: Unrestricted ROM          Functional ROM: Decreased ROM          Muscle strength & Tone: Functionally intact  Muscle strength & Tone: Functionally intact  Sensory: Unimpaired  Sensory: Unimpaired  Palpation: No palpable anomalies  Palpation: Complains of area being tender to palpation   Assessment  Primary Diagnosis & Pertinent Problem List: Diagnoses of Chronic pain of left knee (Primary Area of Pain), Chronic pain syndrome, Pharmacologic therapy, Disorder of skeletal system, and Problems influencing health status were pertinent to this visit.  Visit Diagnosis: 1. Chronic pain of left knee (Primary Area of Pain)   2. Chronic pain syndrome   3. Pharmacologic therapy   4. Disorder of skeletal system   5. Problems influencing health status    Plan of Care  Initial treatment plan:  Please be advised that as per protocol, today's visit has been an evaluation only. We have not taken over the patient's controlled substance management.  Problem-specific plan: No problem-specific Assessment & Plan notes found for this encounter.  Ordered Lab-work, Procedure(s), Referral(s), & Consult(s): Orders Placed This Encounter  Procedures  . GENICULAR NERVE BLOCK  . Comp. Metabolic Panel (12)  . Magnesium  . Vitamin B12  . Sedimentation rate  . 25-Hydroxyvitamin D Lcms D2+D3  . C-reactive protein   Pharmacotherapy: Medications ordered:  No orders of the defined types were placed in this encounter.  Medications administered during this visit: Amber Holland had no medications administered during this visit.   Pharmacotherapy under consideration:  Opioid Analgesics: The patient was informed that  there is no guarantee that she would be a candidate for opioid analgesics. The decision will be made following CDC guidelines. This decision will be based on the results of diagnostic studies, as well as Amber Holland's risk profile.  Membrane stabilizer: To be determined at a later time Muscle relaxant: To be determined at a later time NSAID: To be determined at a later time Other analgesic(s): To be determined at a later time   Interventional therapies under consideration: Amber Holland was informed that there is no guarantee that she would be a candidate for interventional therapies. The decision will be based on the results of diagnostic studies, as well as Amber Holland's risk profile.  Possible procedure(s): Diagnostic Left Genicular Nerve Block Possible Left Genicular RFA   Provider-requested follow-up: Return for 2nd Visit, w/ Dr. Dossie Arbour.  Future Appointments  Date Time Provider [place]  04/04/2018 10:45 AM Milinda Pointer, MD [facility] None  04/07/2018 12:45 PM Milinda Pointer, MD [facility] None  08/09/2018  9:45 AM Rubie Maid, MD [facility] None  Primary Care Physician: Adin Hector, MD Location[facility] Outpatient [facility] Management Facility Note by:  Date: 03/31/2018; Time: 10:54 PM  Pain Score Disclaimer: We use the NRS-11 scale. This is a self-reported, subjective measurement of pain severity with only modest accuracy. It is used primarily to identify changes within a particular patient. It must be understood that outpatient pain scales are significantly less accurate that those used for research, where they can be applied under ideal controlled circumstances with minimal exposure to variables. In reality, the score is likely to be a combination of pain intensity and pain affect, where pain affect describes the degree of emotional arousal or changes in action readiness caused by the sensory experience of pain. Factors such as social and work situation, setting,  emotional state, anxiety levels, expectation, and prior pain experience may influence pain perception and show large inter-individual differences that may also be affected by time variables.  Patient instructions provided during this appointment: Patient Instructions   ____________________________________________________________________________________________  Appointment Policy Summary  It is our goal and responsibility to provide the medical community with assistance in the evaluation and management of patients with chronic pain. Unfortunately our resources are limited. Because we do not have an unlimited amount of time, or available appointments, we are required to closely monitor and manage their use. The following rules exist to maximize their use:  Patient's responsibilities: 1. Punctuality:  At what time should I arrive? You should be physically present in our office 30 minutes before your scheduled appointment. Your scheduled appointment is with your assigned healthcare provider. However, it takes 5-10 minutes to be "checked-in", and another 15 minutes for the nurses to do the admission. If you arrive to our office at the time you were given for your appointment, you will end up being at least 20-25 minutes late to your appointment with the provider. 2. Tardiness:  What happens if I arrive only a few minutes after my scheduled appointment time? You will need to reschedule your appointment. The cutoff is your appointment time. This is why it is so important that you arrive at least 30 minutes before that appointment. If you have an appointment scheduled for 10:00 AM and you arrive at 10:01, you will be required to reschedule your appointment.  3. Plan ahead:  Always assume that you will encounter traffic on your way in. Plan for it. If you are dependent on a driver, make sure they understand these rules and the need to arrive early. 4. Other appointments and responsibilities:  Avoid  scheduling any other appointments before or after your pain clinic appointments.  5. Be prepared:  Write down everything that you need to discuss with your healthcare provider and give this information to the admitting nurse. Write down the medications that you will need refilled. Bring your pills and bottles (even the empty ones), to all of your appointments, except for those where a procedure is scheduled. 6. No children or pets:  Find someone to take care of them. It is not appropriate to bring them in. 7. Scheduling changes:  We request "advanced notification" of any changes or cancellations. 8. Advanced notification:  Defined as a time period of more than 24 hours prior to the originally scheduled appointment. This allows for the appointment to be offered to other patients. 9. Rescheduling:  When a visit is rescheduled, it will require the cancellation of the original appointment. For this reason they both fall within the category of "Cancellations".  10. Cancellations:  They require  advanced notification. Any cancellation less than 24 hours before the  appointment will be recorded as a "No Show". 11. No Show:  Defined as an unkept appointment where the patient failed to notify or declare to the practice their intention or inability to keep the appointment.  Corrective process for repeat offenders:  1. Tardiness: Three (3) episodes of rescheduling due to late arrivals will be recorded as one (1) "No Show". 2. Cancellation or reschedule: Three (3) cancellations or rescheduling will be recorded as one (1) "No Show". 3. "No Shows": Three (3) "No Shows" within a 12 month period will result in discharge from the practice. ____________________________________________________________________________________________  ____________________________________________________________________________________________  Pain Scale  Introduction: The pain score used by this practice is the Verbal  Numerical Rating Scale (VNRS-11). This is an 11-point scale. It is for adults and children 10 years or older. There are significant differences in how the pain score is reported, used, and applied. Forget everything you learned in the past and learn this scoring system.  General Information: The scale should reflect your current level of pain. Unless you are specifically asked for the level of your worst pain, or your average pain. If you are asked for one of these two, then it should be understood that it is over the past 24 hours.  Basic Activities of Daily Living (ADL): Personal hygiene, dressing, eating, transferring, and using restroom.  Instructions: Most patients tend to report their level of pain as a combination of two factors, their physical pain and their psychosocial pain. This last one is also known as "suffering" and it is reflection of how physical pain affects you socially and psychologically. From now on, report them separately. From this point on, when asked to report your pain level, report only your physical pain. Use the following table for reference.  Pain Clinic Pain Levels (0-5/10)  Pain Level Score  Description  No Pain 0   Mild pain 1 Nagging, annoying, but does not interfere with basic activities of daily living (ADL). Patients are able to eat, bathe, get dressed, toileting (being able to get on and off the toilet and perform personal hygiene functions), transfer (move in and out of bed or a chair without assistance), and maintain continence (able to control bladder and bowel functions). Blood pressure and heart rate are unaffected. A normal heart rate for a healthy adult ranges from 60 to 100 bpm (beats per minute).   Mild to moderate pain 2 Noticeable and distracting. Impossible to hide from other people. More frequent flare-ups. Still possible to adapt and function close to normal. It can be very annoying and may have occasional stronger flare-ups. With discipline, patients  may get used to it and adapt.   Moderate pain 3 Interferes significantly with activities of daily living (ADL). It becomes difficult to feed, bathe, get dressed, get on and off the toilet or to perform personal hygiene functions. Difficult to get in and out of bed or a chair without assistance. Very distracting. With effort, it can be ignored when deeply involved in activities.   Moderately severe pain 4 Impossible to ignore for more than a few minutes. With effort, patients may still be able to manage work or participate in some social activities. Very difficult to concentrate. Signs of autonomic nervous system discharge are evident: dilated pupils (mydriasis); mild sweating (diaphoresis); sleep interference. Heart rate becomes elevated (>115 bpm). Diastolic blood pressure (lower number) rises above 100 mmHg. Patients find relief in laying down and not moving.  Severe pain 5 Intense and extremely unpleasant. Associated with frowning face and frequent crying. Pain overwhelms the senses.  Ability to do any activity or maintain social relationships becomes significantly limited. Conversation becomes difficult. Pacing back and forth is common, as getting into a comfortable position is nearly impossible. Pain wakes you up from deep sleep. Physical signs will be obvious: pupillary dilation; increased sweating; goosebumps; brisk reflexes; cold, clammy hands and feet; nausea, vomiting or dry heaves; loss of appetite; significant sleep disturbance with inability to fall asleep or to remain asleep. When persistent, significant weight loss is observed due to the complete loss of appetite and sleep deprivation.  Blood pressure and heart rate becomes significantly elevated. Caution: If elevated blood pressure triggers a pounding headache associated with blurred vision, then the patient should immediately seek attention at an urgent or emergency care unit, as these may be signs of an impending stroke.    Emergency  Department Pain Levels (6-10/10)  Emergency Room Pain 6 Severely limiting. Requires emergency care and should not be seen or managed at an outpatient pain management facility. Communication becomes difficult and requires great effort. Assistance to reach the emergency department may be required. Facial flushing and profuse sweating along with potentially dangerous increases in heart rate and blood pressure will be evident.   Distressing pain 7 Self-care is very difficult. Assistance is required to transport, or use restroom. Assistance to reach the emergency department will be required. Tasks requiring coordination, such as bathing and getting dressed become very difficult.   Disabling pain 8 Self-care is no longer possible. At this level, pain is disabling. The individual is unable to do even the most "basic" activities such as walking, eating, bathing, dressing, transferring to a bed, or toileting. Fine motor skills are lost. It is difficult to think clearly.   Incapacitating pain 9 Pain becomes incapacitating. Thought processing is no longer possible. Difficult to remember your own name. Control of movement and coordination are lost.   The worst pain imaginable 10 At this level, most patients pass out from pain. When this level is reached, collapse of the autonomic nervous system occurs, leading to a sudden drop in blood pressure and heart rate. This in turn results in a temporary and dramatic drop in blood flow to the brain, leading to a loss of consciousness. Fainting is one of the body's self defense mechanisms. Passing out puts the brain in a calmed state and causes it to shut down for a while, in order to begin the healing process.    Summary: 1. Refer to this scale when providing [place] with your pain level. 2. Be accurate and careful when reporting your pain level. This will help with your care. 3. Over-reporting your pain level will lead to loss of credibility. 4. Even a level of 1/10 means  that there is pain and will be treated at our facility. 5. High, inaccurate reporting will be documented as "Symptom Exaggeration", leading to loss of credibility and suspicions of possible secondary gains such as obtaining more narcotics, or wanting to appear disabled, for fraudulent reasons. 6. Only pain levels of 5 or below will be seen at our facility. 7. Pain levels of 6 and above will be sent to the Emergency Department and the appointment cancelled. ____________________________________________________________________________________________   ____________________________________________________________________________________________  Appointment Policy Summary  It is our goal and responsibility to provide the medical community with assistance in the evaluation and management of patients with chronic pain. Unfortunately our resources are limited. Because we do  not have an unlimited amount of time, or available appointments, we are required to closely monitor and manage their use. The following rules exist to maximize their use:  Patient's responsibilities: 1. Punctuality:  At what time should I arrive? You should be physically present in our office 30 minutes before your scheduled appointment. Your scheduled appointment is with your assigned healthcare provider. However, it takes 5-10 minutes to be "checked-in", and another 15 minutes for the nurses to do the admission. If you arrive to our office at the time you were given for your appointment, you will end up being at least 20-25 minutes late to your appointment with the provider. 2. Tardiness:  What happens if I arrive only a few minutes after my scheduled appointment time? You will need to reschedule your appointment. The cutoff is your appointment time. This is why it is so important that you arrive at least 30 minutes before that appointment. If you have an appointment scheduled for 10:00 AM and you arrive at 10:01, you will be required  to reschedule your appointment.  3. Plan ahead:  Always assume that you will encounter traffic on your way in. Plan for it. If you are dependent on a driver, make sure they understand these rules and the need to arrive early. 4. Other appointments and responsibilities:  Avoid scheduling any other appointments before or after your pain clinic appointments.  5. Be prepared:  Write down everything that you need to discuss with your healthcare provider and give this information to the admitting nurse. Write down the medications that you will need refilled. Bring your pills and bottles (even the empty ones), to all of your appointments, except for those where a procedure is scheduled. 6. No children or pets:  Find someone to take care of them. It is not appropriate to bring them in. 7. Scheduling changes:  We request "advanced notification" of any changes or cancellations. 8. Advanced notification:  Defined as a time period of more than 24 hours prior to the originally scheduled appointment. This allows for the appointment to be offered to other patients. 9. Rescheduling:  When a visit is rescheduled, it will require the cancellation of the original appointment. For this reason they both fall within the category of "Cancellations".  10. Cancellations:  They require advanced notification. Any cancellation less than 24 hours before the  appointment will be recorded as a "No Show". 11. No Show:  Defined as an unkept appointment where the patient failed to notify or declare to the practice their intention or inability to keep the appointment.  Corrective process for repeat offenders:  4. Tardiness: Three (3) episodes of rescheduling due to late arrivals will be recorded as one (1) "No Show". 5. Cancellation or reschedule: Three (3) cancellations or rescheduling will be recorded as one (1) "No Show". 6. "No Shows": Three (3) "No Shows" within a 12 month period will result in discharge from the  practice. ____________________________________________________________________________________________  ____________________________________________________________________________________________  Appointment Policy Summary  It is our goal and responsibility to provide the medical community with assistance in the evaluation and management of patients with chronic pain. Unfortunately our resources are limited. Because we do not have an unlimited amount of time, or available appointments, we are required to closely monitor and manage their use. The following rules exist to maximize their use:  Patient's responsibilities: 1. Punctuality:  At what time should I arrive? You should be physically present in our office 30 minutes before your scheduled appointment. Your scheduled appointment  is with your assigned healthcare provider. However, it takes 5-10 minutes to be "checked-in", and another 15 minutes for the nurses to do the admission. If you arrive to our office at the time you were given for your appointment, you will end up being at least 20-25 minutes late to your appointment with the provider. 2. Tardiness:  What happens if I arrive only a few minutes after my scheduled appointment time? You will need to reschedule your appointment. The cutoff is your appointment time. This is why it is so important that you arrive at least 30 minutes before that appointment. If you have an appointment scheduled for 10:00 AM and you arrive at 10:01, you will be required to reschedule your appointment.  3. Plan ahead:  Always assume that you will encounter traffic on your way in. Plan for it. If you are dependent on a driver, make sure they understand these rules and the need to arrive early. 4. Other appointments and responsibilities:  Avoid scheduling any other appointments before or after your pain clinic appointments.  5. Be prepared:  Write down everything that you need to discuss with your healthcare  provider and give this information to the admitting nurse. Write down the medications that you will need refilled. Bring your pills and bottles (even the empty ones), to all of your appointments, except for those where a procedure is scheduled. 6. No children or pets:  Find someone to take care of them. It is not appropriate to bring them in. 7. Scheduling changes:  We request "advanced notification" of any changes or cancellations. 8. Advanced notification:  Defined as a time period of more than 24 hours prior to the originally scheduled appointment. This allows for the appointment to be offered to other patients. 9. Rescheduling:  When a visit is rescheduled, it will require the cancellation of the original appointment. For this reason they both fall within the category of "Cancellations".  10. Cancellations:  They require advanced notification. Any cancellation less than 24 hours before the  appointment will be recorded as a "No Show". 11. No Show:  Defined as an unkept appointment where the patient failed to notify or declare to the practice their intention or inability to keep the appointment.  Corrective process for repeat offenders:  7. Tardiness: Three (3) episodes of rescheduling due to late arrivals will be recorded as one (1) "No Show". 8. Cancellation or reschedule: Three (3) cancellations or rescheduling will be recorded as one (1) "No Show". 9. "No Shows": Three (3) "No Shows" within a 12 month period will result in discharge from the practice. ____________________________________________________________________________________________  ____________________________________________________________________________________________  Pain Scale  Introduction: The pain score used by this practice is the Verbal Numerical Rating Scale (VNRS-11). This is an 11-point scale. It is for adults and children 10 years or older. There are significant differences in how the pain score is  reported, used, and applied. Forget everything you learned in the past and learn this scoring system.  General Information: The scale should reflect your current level of pain. Unless you are specifically asked for the level of your worst pain, or your average pain. If you are asked for one of these two, then it should be understood that it is over the past 24 hours.  Basic Activities of Daily Living (ADL): Personal hygiene, dressing, eating, transferring, and using restroom.  Instructions: Most patients tend to report their level of pain as a combination of two factors, their physical pain and their psychosocial pain. This  last one is also known as "suffering" and it is reflection of how physical pain affects you socially and psychologically. From now on, report them separately. From this point on, when asked to report your pain level, report only your physical pain. Use the following table for reference.  Pain Clinic Pain Levels (0-5/10)  Pain Level Score  Description  No Pain 0   Mild pain 1 Nagging, annoying, but does not interfere with basic activities of daily living (ADL). Patients are able to eat, bathe, get dressed, toileting (being able to get on and off the toilet and perform personal hygiene functions), transfer (move in and out of bed or a chair without assistance), and maintain continence (able to control bladder and bowel functions). Blood pressure and heart rate are unaffected. A normal heart rate for a healthy adult ranges from 60 to 100 bpm (beats per minute).   Mild to moderate pain 2 Noticeable and distracting. Impossible to hide from other people. More frequent flare-ups. Still possible to adapt and function close to normal. It can be very annoying and may have occasional stronger flare-ups. With discipline, patients may get used to it and adapt.   Moderate pain 3 Interferes significantly with activities of daily living (ADL). It becomes difficult to feed, bathe, get dressed, get  on and off the toilet or to perform personal hygiene functions. Difficult to get in and out of bed or a chair without assistance. Very distracting. With effort, it can be ignored when deeply involved in activities.   Moderately severe pain 4 Impossible to ignore for more than a few minutes. With effort, patients may still be able to manage work or participate in some social activities. Very difficult to concentrate. Signs of autonomic nervous system discharge are evident: dilated pupils (mydriasis); mild sweating (diaphoresis); sleep interference. Heart rate becomes elevated (>115 bpm). Diastolic blood pressure (lower number) rises above 100 mmHg. Patients find relief in laying down and not moving.   Severe pain 5 Intense and extremely unpleasant. Associated with frowning face and frequent crying. Pain overwhelms the senses.  Ability to do any activity or maintain social relationships becomes significantly limited. Conversation becomes difficult. Pacing back and forth is common, as getting into a comfortable position is nearly impossible. Pain wakes you up from deep sleep. Physical signs will be obvious: pupillary dilation; increased sweating; goosebumps; brisk reflexes; cold, clammy hands and feet; nausea, vomiting or dry heaves; loss of appetite; significant sleep disturbance with inability to fall asleep or to remain asleep. When persistent, significant weight loss is observed due to the complete loss of appetite and sleep deprivation.  Blood pressure and heart rate becomes significantly elevated. Caution: If elevated blood pressure triggers a pounding headache associated with blurred vision, then the patient should immediately seek attention at an urgent or emergency care unit, as these may be signs of an impending stroke.    Emergency Department Pain Levels (6-10/10)  Emergency Room Pain 6 Severely limiting. Requires emergency care and should not be seen or managed at an outpatient pain management  facility. Communication becomes difficult and requires great effort. Assistance to reach the emergency department may be required. Facial flushing and profuse sweating along with potentially dangerous increases in heart rate and blood pressure will be evident.   Distressing pain 7 Self-care is very difficult. Assistance is required to transport, or use restroom. Assistance to reach the emergency department will be required. Tasks requiring coordination, such as bathing and getting dressed become very difficult.   Disabling  pain 8 Self-care is no longer possible. At this level, pain is disabling. The individual is unable to do even the most "basic" activities such as walking, eating, bathing, dressing, transferring to a bed, or toileting. Fine motor skills are lost. It is difficult to think clearly.   Incapacitating pain 9 Pain becomes incapacitating. Thought processing is no longer possible. Difficult to remember your own name. Control of movement and coordination are lost.   The worst pain imaginable 10 At this level, most patients pass out from pain. When this level is reached, collapse of the autonomic nervous system occurs, leading to a sudden drop in blood pressure and heart rate. This in turn results in a temporary and dramatic drop in blood flow to the brain, leading to a loss of consciousness. Fainting is one of the body's self defense mechanisms. Passing out puts the brain in a calmed state and causes it to shut down for a while, in order to begin the healing process.    Summary: 8. Refer to this scale when providing [place] with your pain level. 9. Be accurate and careful when reporting your pain level. This will help with your care. 10. Over-reporting your pain level will lead to loss of credibility. 11. Even a level of 1/10 means that there is pain and will be treated at our facility. 12. High, inaccurate reporting will be documented as "Symptom Exaggeration", leading to loss of credibility  and suspicions of possible secondary gains such as obtaining more narcotics, or wanting to appear disabled, for fraudulent reasons. 13. Only pain levels of 5 or below will be seen at our facility. 14. Pain levels of 6 and above will be sent to the Emergency Department and the appointment cancelled. ____________________________________________________________________________________________

## 2018-03-31 NOTE — Patient Instructions (Addendum)

## 2018-03-31 NOTE — Progress Notes (Signed)
Safety precautions to be maintained throughout the outpatient stay will include: orient to surroundings, keep bed in low position, maintain call bell within reach at all times, provide assistance with transfer out of bed and ambulation.  

## 2018-04-03 DIAGNOSIS — M1712 Unilateral primary osteoarthritis, left knee: Secondary | ICD-10-CM | POA: Insufficient documentation

## 2018-04-03 NOTE — Progress Notes (Signed)
Patient's Name: Amber Holland  MRN: [id number]  Referring Provider: Adin Hector, MD  DOB: 1920/04/14  PCP: Adin Hector, MD  DOS: 04/04/2018  Note by: Gaspar Cola, MD  Service setting: Ambulatory outpatient  Specialty: Interventional Pain Management  Location: [facility] (AMB) Pain Management Facility    Patient type: Established   Primary Reason(s) for Visit: Encounter for evaluation before starting new chronic pain management plan of care (Level of risk: moderate) CC: Knee Pain (left)  HPI  Amber Holland is a 84 y.o. year old, female patient, who comes today for a follow-up evaluation to review the test results and decide on a treatment plan. She has Atrial fibrillation ([place]); B12 deficiency; Bilateral carpal tunnel syndrome; Benign essential hypertension; Hereditary and idiopathic neuropathy; Urinary incontinence; Cystocele, midline; Vaginal pessary in situ; Peripheral polyneuropathy; Chronic knee pain (Primary Area of Pain) (Left); Chronic pain syndrome; Pharmacologic therapy; Disorder of skeletal system; Problems influencing health status; and Osteoarthritis of knee (Left) on their problem list. Her primarily concern today is the Knee Pain (left)  Pain Assessment: Location: Left Knee Radiating: na Onset: More than a month ago Duration: Chronic pain Quality: Discomfort, Nagging, Tender Severity: 0-No pain(while sitting she has no pain but when walking she experiences moderate pain )/10 (subjective, self-reported pain score)  Note: Reported level is compatible with observation.                         When using our objective Pain Scale, levels between 6 and 10/10 are said to belong in an emergency room, as it progressively worsens from a 6/10, described as severely limiting, requiring emergency care not usually available at an outpatient pain management facility. At a 6/10 level, communication becomes difficult and requires great effort. Assistance to reach the emergency  department may be required. Facial flushing and profuse sweating along with potentially dangerous increases in heart rate and blood pressure will be evident. Effect on ADL: sometimes she has to sit down and take a break d/t the increase in pain.  Timing: Intermittent Modifying factors: rest, muscle rub  Amber Holland comes in today for a follow-up visit after her initial evaluation on 03/31/2018. Today we went over the results of her tests. These were explained in "Layman's terms". During today's appointment we went over my diagnostic impression, as well as the proposed treatment plan.  According to the patient her primary area of pain is in her left knee. She denies any injury. She denies any pervious surgery.  She has had cortisone injection and Synvisc 3 series which was not effective. She has tired Physical therapy which was not effective. She had xray's of her left knee 04/2016.   She denies any other areas of pain.   Today I took the time to provide the patient with information regarding this pain practice. The patient was informed that the practice is divided into two sections: an interventional pain management section, as well as a completely separate and distinct medication management section. I explained that there are procedure days for interventional therapies, and evaluation days for follow-ups and medication management. Because of the amount of documentation required during both, they are kept separated. This means that there is the possibility that she may be scheduled for a procedure on one day, and medication management the next. I have also informed her that because of staffing and facility limitations, this practice will no longer take patients for medication management only. To illustrate the  reasons for this, I gave the patient the example of surgeons, and how inappropriate it would be to refer a patient to his/her care, just to write for the post-surgical antibiotics on a surgery  done by a different surgeon.   In considering the treatment plan options, Ms. Kracht was reminded that I no longer take patients for medication management only. I asked her to let me know if she had no intention of taking advantage of the interventional therapies, so that we could make arrangements to provide this space to someone interested. I also made it clear that undergoing interventional therapies for the purpose of getting pain medications is very inappropriate on the part of a patient, and it will not be tolerated in this practice. This type of behavior would suggest true addiction and therefore it requires referral to an addiction specialist.   Further details on both, my assessment(s), as well as the proposed treatment plan, please see below.  Controlled Substance Pharmacotherapy Assessment REMS (Risk Evaluation and Mitigation Strategy)  Analgesic: Hydrocodone/APAP 5/325 2 tablets p.o. every 4 hours PRN for pain #60 (last written on 10/16/2016) MME/day: 0 mg/day. Pill Count: None expected due to no prior prescriptions written by our practice. No notes on file Pharmacokinetics: Liberation and absorption (onset of action): WNL Distribution (time to peak effect): WNL Metabolism and excretion (duration of action): WNL         Pharmacodynamics: Desired effects: Analgesia: Ms. Aust reports >50% benefit. Functional ability: Patient reports that medication allows her to accomplish basic ADLs Clinically meaningful improvement in function (CMIF): Sustained CMIF goals met Perceived effectiveness: Described as relatively effective, allowing for increase in activities of daily living (ADL) Undesirable effects: Side-effects or Adverse reactions: None reported Monitoring: [place] PMP: Online review of the past 28-monthperiod previously conducted. Not applicable at this point since we have not taken over the patient's medication management yet. Treatment compliance: Not applicable Risk  Assessment Profile: Aberrant behavior: See initial evaluations. None observed or detected today Comorbid factors increasing risk of overdose: See initial evaluation. No additional risks detected today Medical Psychology Evaluation: Please see scanned results in medical record. Opioid Risk Tool - 03/31/18 1318      Family History of Substance Abuse   Alcohol  Negative    Illegal Drugs  Negative    Rx Drugs  Negative      Personal History of Substance Abuse   Alcohol  Negative    Illegal Drugs  Negative    Rx Drugs  Negative      Age   Age between 123-45years   No      History of Preadolescent Sexual Abuse   History of Preadolescent Sexual Abuse  Negative or Female      Psychological Disease   Psychological Disease  Negative    Depression  Negative      Total Score   Opioid Risk Tool Scoring  0    Opioid Risk Interpretation  Low Risk      ORT Scoring interpretation table:  Score <3 = Low Risk for SUD  Score between 4-7 = Moderate Risk for SUD  Score >8 = High Risk for Opioid Abuse   Risk Mitigation Strategies:  Patient opioid safety counseling: Not applicable. Patient-Prescriber Agreement (PPA): No agreement signed.  Controlled substance notification to other providers: Not applicable  Pharmacologic Plan: No opioid analgesic prescribed.             Laboratory Chemistry  Inflammation Markers (CRP: Acute Phase) (ESR: Chronic Phase)  Lab Results  Component Value Date   CRP 1.8 03/31/2018   ESRSEDRATE 10 03/31/2018                         Renal Function Markers Lab Results  Component Value Date   BUN 27 03/31/2018   CREATININE 0.66 03/31/2018   GFRAA 87 03/31/2018   GFRNONAA 75 03/31/2018                              Hepatic Function Markers Lab Results  Component Value Date   AST 9 03/31/2018   ALT 14 04/18/2014   ALBUMIN 3.8 03/31/2018   ALKPHOS 62 03/31/2018                        Electrolytes Lab Results  Component Value Date   NA 143 03/31/2018    K 4.1 03/31/2018   CL 105 03/31/2018   CALCIUM 8.8 03/31/2018   MG 2.1 03/31/2018                        Neuropathy Markers Lab Results  Component Value Date   VITAMINB12 1,684 (H) 03/31/2018                        Bone Pathology Markers Lab Results  Component Value Date   25OHVITD1 WILL FOLLOW 03/31/2018   25OHVITD2 WILL FOLLOW 03/31/2018   25OHVITD3 WILL FOLLOW 03/31/2018                         Coagulation Parameters Lab Results  Component Value Date   PLT 323 10/06/2017                        Cardiovascular Markers Lab Results  Component Value Date   TROPONINI <0.03 10/06/2017   HGB 12.3 10/06/2017   HCT 37.6 10/06/2017                         Note: Lab results reviewed.  Recent Diagnostic Imaging Review   Complexity Note: No results found under the [facility] electronic medical record.                         Meds   Current Outpatient Medications:  .  acetaminophen (TYLENOL) 325 MG tablet, Take 650 mg by mouth., Disp: , Rfl:  .  amLODipine (NORVASC) 5 MG tablet, TAKE ONE-HALF (1/2) TABLET DAILY, Disp: , Rfl:  .  aspirin EC 81 MG tablet, Take by mouth., Disp: , Rfl:  .  candesartan (ATACAND) 32 MG tablet, TAKE 1 TABLET DAILY, Disp: , Rfl:  .  celecoxib (CELEBREX) 200 MG capsule, , Disp: , Rfl:  .  Cyanocobalamin (VITAMIN B-12 PO), Take by mouth., Disp: , Rfl:  .  diclofenac sodium (VOLTAREN) 1 % GEL, Apply topically., Disp: , Rfl:  .  omeprazole (PRILOSEC) 40 MG capsule, TAKE 1 CAPSULE DAILY, Disp: , Rfl:  .  propafenone (RYTHMOL) 225 MG tablet, TAKE 1 TABLET BY MOUTH TWICE A DAY, Disp: , Rfl:  .  raloxifene (EVISTA) 60 MG tablet, TAKE 1 TABLET DAILY, Disp: , Rfl:  .  trospium (SANCTURA) 20 MG tablet, TAKE 1 TABLET TWICE A DAY, Disp: 180 tablet, Rfl: 3  ROS  Constitutional: Denies any fever or chills Gastrointestinal: No reported hemesis, hematochezia, vomiting, or acute GI distress Musculoskeletal: Denies any acute onset joint swelling, redness,  loss of ROM, or weakness Neurological: No reported episodes of acute onset apraxia, aphasia, dysarthria, agnosia, amnesia, paralysis, loss of coordination, or loss of consciousness  Allergies  Ms. Hobbs is allergic to gabapentin.  [place]  Drug: Ms. Boyington  reports that she does not use drugs. Alcohol:  reports that she drinks alcohol. Tobacco:  reports that she has never smoked. She has never used smokeless tobacco. Medical:  has a past medical history of AF (atrial fibrillation) ([place]), Arthritis, Constipation, Cystocele, GERD (gastroesophageal reflux disease), Hypertension, Nocturia, Osteoporosis, Urethral caruncle, Urinary incontinence, and Vaginal atrophy. Surgical: Ms. Pesch  has a past surgical history that includes No past surgeries and Carpal tunnel release. Family: family history is not on file.  Constitutional Exam  General appearance: Well nourished, well developed, and well hydrated. In no apparent acute distress Vitals:   04/04/18 1043  BP: (!) 167/69  Pulse: 64  Resp: 16  Temp: 98 F (36.7 C)  TempSrc: Oral  SpO2: 99%  Weight: 110 lb (49.9 kg)  Height: '4\' 10"'$  (1.473 m)   BMI Assessment: Estimated body mass index is 22.99 kg/m as calculated from the following:   Height as of this encounter: '4\' 10"'$  (1.473 m).   Weight as of this encounter: 110 lb (49.9 kg).  BMI interpretation table: BMI level Category Range association with higher incidence of chronic pain  <18 kg/m2 Underweight   18.5-24.9 kg/m2 Ideal body weight   25-29.9 kg/m2 Overweight Increased incidence by 20%  30-34.9 kg/m2 Obese (Class I) Increased incidence by 68%  35-39.9 kg/m2 Severe obesity (Class II) Increased incidence by 136%  >40 kg/m2 Extreme obesity (Class III) Increased incidence by 254%   Patient's current BMI Ideal Body weight  Body mass index is 22.99 kg/m. Patient must be at least 60 in tall to calculate ideal body weight   BMI Readings from Last 4 Encounters:  04/04/18 22.99  kg/m  03/31/18 22.99 kg/m  02/16/18 21.00 kg/m  11/10/17 20.67 kg/m   Wt Readings from Last 4 Encounters:  04/04/18 110 lb (49.9 kg)  03/31/18 110 lb (49.9 kg)  02/16/18 114 lb 12.8 oz (52.1 kg)  11/10/17 113 lb (51.3 kg)  Psych/Mental status: Alert, oriented x 3 (person, place, & time)       Eyes: PERLA Respiratory: No evidence of acute respiratory distress  Cervical Spine Area Exam  Skin & Axial Inspection: No masses, redness, edema, swelling, or associated skin lesions Alignment: Symmetrical Functional ROM: Unrestricted ROM      Stability: No instability detected Muscle Tone/Strength: Functionally intact. No obvious neuro-muscular anomalies detected. Sensory (Neurological): Unimpaired Palpation: No palpable anomalies              Upper Extremity (UE) Exam    Side: Right upper extremity  Side: Left upper extremity  Skin & Extremity Inspection: Skin color, temperature, and hair growth are WNL. No peripheral edema or cyanosis. No masses, redness, swelling, asymmetry, or associated skin lesions. No contractures.  Skin & Extremity Inspection: Skin color, temperature, and hair growth are WNL. No peripheral edema or cyanosis. No masses, redness, swelling, asymmetry, or associated skin lesions. No contractures.  Functional ROM: Unrestricted ROM          Functional ROM: Unrestricted ROM          Muscle Tone/Strength: Functionally intact. No obvious neuro-muscular anomalies detected.  Muscle Tone/Strength: Functionally  intact. No obvious neuro-muscular anomalies detected.  Sensory (Neurological): Unimpaired          Sensory (Neurological): Unimpaired          Palpation: No palpable anomalies              Palpation: No palpable anomalies              Specialized Test(s): Deferred         Specialized Test(s): Deferred          Thoracic Spine Area Exam  Skin & Axial Inspection: No masses, redness, or swelling Alignment: Symmetrical Functional ROM: Unrestricted ROM Stability: No  instability detected Muscle Tone/Strength: Functionally intact. No obvious neuro-muscular anomalies detected. Sensory (Neurological): Unimpaired Muscle strength & Tone: No palpable anomalies  Lumbar Spine Area Exam  Skin & Axial Inspection: No masses, redness, or swelling Alignment: Symmetrical Functional ROM: Unrestricted ROM       Stability: No instability detected Muscle Tone/Strength: Functionally intact. No obvious neuro-muscular anomalies detected. Sensory (Neurological): Unimpaired Palpation: No palpable anomalies       Provocative Tests: Lumbar Hyperextension and rotation test: evaluation deferred today       Lumbar Lateral bending test: evaluation deferred today       Patrick's Maneuver: evaluation deferred today                    Gait & Posture Assessment  Ambulation: Patient came in today in a wheel chair Gait: Significantly limited. Dependent on assistive device to ambulate Posture: Difficulty standing up straight, due to pain   Lower Extremity Exam    Side: Right lower extremity  Side: Left lower extremity  Stability: No instability observed          Stability: No instability observed          Skin & Extremity Inspection: Skin color, temperature, and hair growth are WNL. No peripheral edema or cyanosis. No masses, redness, swelling, asymmetry, or associated skin lesions. No contractures.  Skin & Extremity Inspection: Skin color, temperature, and hair growth are WNL. No peripheral edema or cyanosis. No masses, redness, swelling, asymmetry, or associated skin lesions. No contractures.  Functional ROM: Unrestricted ROM                  Functional ROM: Decreased ROM for knee joint          Muscle Tone/Strength: Functionally intact. No obvious neuro-muscular anomalies detected.  Muscle Tone/Strength: Functionally intact. No obvious neuro-muscular anomalies detected.  Sensory (Neurological): Unimpaired  Sensory (Neurological): Arthropathic arthralgia  Palpation: No palpable  anomalies  Palpation: Complains of area being tender to palpation, primarily in the medial aspect of the knee over the medial collateral ligament.   Assessment & Plan  Primary Diagnosis & Pertinent Problem List: The primary encounter diagnosis was Chronic knee pain (Primary Area of Pain) (Left). Diagnoses of Osteoarthritis of knee (Left), Chronic pain syndrome, and Peripheral polyneuropathy were also pertinent to this visit.  Visit Diagnosis: 1. Chronic knee pain (Primary Area of Pain) (Left)   2. Osteoarthritis of knee (Left)   3. Chronic pain syndrome   4. Peripheral polyneuropathy    Problems updated and reviewed during this visit: No problems updated.   Time Note: Greater than 50% of the 25 minute(s) of face-to-face time spent with Ms. Wiler, was spent in counseling/coordination of care regarding: the treatment plan, treatment alternatives, the risks and possible complications of proposed treatment, realistic expectations, the goals of pain management (increased in functionality), the importance  of providing [place] with accurate post-procedure information and the need to collect and read the AVS material. Plan of Care  Pharmacotherapy (Medications Ordered): No orders of the defined types were placed in this encounter.  Procedure Orders    No procedure(s) ordered today   Lab Orders  No laboratory test(s) ordered today   Imaging Orders  No imaging studies ordered today   Referral Orders  No referral(s) requested today    Pharmacological management options:  Opioid Analgesics: None prescribed. Membrane stabilizer: None prescribed at this time Muscle relaxant: None prescribed at this time NSAID: None prescribed at this time Other analgesic(s): None prescribed at this time   Interventional management options: Planned, scheduled, and/or pending:    Diagnostic left knee genicular nerve block #1 scheduled on 04/07/2018.   Considering:   Diagnostic Left Genicular Nerve Block   Possible Left Genicular RFA    PRN Procedures:   None at this time   Provider-requested follow-up: Return for Procedure (w/ sedation): (L) Genicular Nerve Block.  Future Appointments  Date Time Provider [place]  04/07/2018 12:45 PM Milinda Pointer, MD [facility] None  08/09/2018  9:45 AM Rubie Maid, MD [facility] [place] None    Primary Care Physician: Adin Hector, MD Location: [facility] Note by: Gaspar Cola, MD Date: 04/04/2018; Time: 11:32 AM

## 2018-04-04 ENCOUNTER — Encounter: Payer: Self-pay | Admitting: Pain Medicine

## 2018-04-04 ENCOUNTER — Ambulatory Visit: Payer: Medicare Other | Admitting: Pain Medicine

## 2018-04-04 ENCOUNTER — Ambulatory Visit: Payer: Medicare Other | Attending: Pain Medicine | Admitting: Pain Medicine

## 2018-04-04 VITALS — BP 167/69 | HR 64 | Temp 98.0°F | Resp 16 | Ht <= 58 in | Wt 110.0 lb

## 2018-04-04 DIAGNOSIS — Z7982 Long term (current) use of aspirin: Secondary | ICD-10-CM | POA: Insufficient documentation

## 2018-04-04 DIAGNOSIS — F329 Major depressive disorder, single episode, unspecified: Secondary | ICD-10-CM | POA: Diagnosis not present

## 2018-04-04 DIAGNOSIS — K5900 Constipation, unspecified: Secondary | ICD-10-CM | POA: Diagnosis not present

## 2018-04-04 DIAGNOSIS — Z888 Allergy status to other drugs, medicaments and biological substances status: Secondary | ICD-10-CM | POA: Insufficient documentation

## 2018-04-04 DIAGNOSIS — I10 Essential (primary) hypertension: Secondary | ICD-10-CM | POA: Insufficient documentation

## 2018-04-04 DIAGNOSIS — G5603 Carpal tunnel syndrome, bilateral upper limbs: Secondary | ICD-10-CM | POA: Insufficient documentation

## 2018-04-04 DIAGNOSIS — M1712 Unilateral primary osteoarthritis, left knee: Secondary | ICD-10-CM

## 2018-04-04 DIAGNOSIS — G8929 Other chronic pain: Secondary | ICD-10-CM | POA: Diagnosis not present

## 2018-04-04 DIAGNOSIS — M25562 Pain in left knee: Secondary | ICD-10-CM

## 2018-04-04 DIAGNOSIS — K219 Gastro-esophageal reflux disease without esophagitis: Secondary | ICD-10-CM | POA: Diagnosis not present

## 2018-04-04 DIAGNOSIS — G894 Chronic pain syndrome: Secondary | ICD-10-CM | POA: Diagnosis present

## 2018-04-04 DIAGNOSIS — G629 Polyneuropathy, unspecified: Secondary | ICD-10-CM

## 2018-04-04 DIAGNOSIS — R32 Unspecified urinary incontinence: Secondary | ICD-10-CM | POA: Diagnosis not present

## 2018-04-04 DIAGNOSIS — E538 Deficiency of other specified B group vitamins: Secondary | ICD-10-CM | POA: Diagnosis not present

## 2018-04-04 DIAGNOSIS — I4891 Unspecified atrial fibrillation: Secondary | ICD-10-CM | POA: Insufficient documentation

## 2018-04-04 DIAGNOSIS — Z79899 Other long term (current) drug therapy: Secondary | ICD-10-CM | POA: Insufficient documentation

## 2018-04-04 NOTE — Patient Instructions (Addendum)
____________________________________________________________________________________________  Preparing for Procedure with Sedation  Instructions: . Oral Intake: Do not eat or drink anything for at least 8 hours prior to your procedure. . Transportation: Public transportation is not allowed. Bring an adult driver. The driver must be physically present in our waiting room before any procedure can be started. Marland Kitchen Physical Assistance: Bring an adult physically capable of assisting you, in the event you need help. This adult should keep you company at home for at least 6 hours after the procedure. . Blood Pressure Medicine: Take your blood pressure medicine with a sip of water the morning of the procedure. . Blood thinners:  . Diabetics on insulin: Notify the staff so that you can be scheduled 1st case in the morning. If your diabetes requires high dose insulin, take only  of your normal insulin dose the morning of the procedure and notify the staff that you have done so. . Preventing infections: Shower with an antibacterial soap the morning of your procedure. . Build-up your immune system: Take 1000 mg of Vitamin C with every meal (3 times a day) the day prior to your procedure. Marland Kitchen Antibiotics: Inform the staff if you have a condition or reason that requires you to take antibiotics before dental procedures. . Pregnancy: If you are pregnant, call and cancel the procedure. . Sickness: If you have a cold, fever, or any active infections, call and cancel the procedure. . Arrival: You must be in the facility at least 30 minutes prior to your scheduled procedure. . Children: Do not bring children with you. . Dress appropriately: Bring dark clothing that you would not mind if they get stained. . Valuables: Do not bring any jewelry or valuables.  Procedure appointments are reserved for interventional treatments only. Marland Kitchen No Prescription Refills. . No medication changes will be discussed during procedure  appointments. . No disability issues will be discussed.  Remember:  Regular Business hours are:  Monday to Thursday 8:00 AM to 4:00 PM  Provider's Schedule: Milinda Pointer, MD:  Procedure days: Tuesday and Thursday 7:30 AM to 4:00 PM  Gillis Santa, MD:  Procedure days: Monday and Wednesday 7:30 AM to 4:00 PM ____________________________________________________________________________________________   ____________________________________________________________________________________________  General Risks and Possible Complications  Patient Responsibilities: It is important that you read this as it is part of your informed consent. It is our duty to inform you of the risks and possible complications associated with treatments offered to you. It is your responsibility as a patient to read this and to ask questions about anything that is not clear or that you believe was not covered in this document.  Patient's Rights: You have the right to refuse treatment. You also have the right to change your mind, even after initially having agreed to have the treatment done. However, under this last option, if you wait until the last second to change your mind, you may be charged for the materials used up to that point.  Introduction: Medicine is not an Chief Strategy Officer. Everything in Medicine, including the lack of treatment(s), carries the potential for danger, harm, or loss (which is by definition: Risk). In Medicine, a complication is a secondary problem, condition, or disease that can aggravate an already existing one. All treatments carry the risk of possible complications. The fact that a side effects or complications occurs, does not imply that the treatment was conducted incorrectly. It must be clearly understood that these can happen even when everything is done following the highest safety standards.  No treatment:  You can choose not to proceed with the proposed treatment alternative. The  "PRO(s)" would include: avoiding the risk of complications associated with the therapy. The "CON(s)" would include: not getting any of the treatment benefits. These benefits fall under one of three categories: diagnostic; therapeutic; and/or palliative. Diagnostic benefits include: getting information which can ultimately lead to improvement of the disease or symptom(s). Therapeutic benefits are those associated with the successful treatment of the disease. Finally, palliative benefits are those related to the decrease of the primary symptoms, without necessarily curing the condition (example: decreasing the pain from a flare-up of a chronic condition, such as incurable terminal cancer).  General Risks and Complications: These are associated to most interventional treatments. They can occur alone, or in combination. They fall under one of the following six (6) categories: no benefit or worsening of symptoms; bleeding; infection; nerve damage; allergic reactions; and/or death. 1. No benefits or worsening of symptoms: In Medicine there are no guarantees, only probabilities. No healthcare provider can ever guarantee that a medical treatment will work, they can only state the probability that it may. Furthermore, there is always the possibility that the condition may worsen, either directly, or indirectly, as a consequence of the treatment. 2. Bleeding: This is more common if the patient is taking a blood thinner, either prescription or over the counter (example: Goody Powders, Fish oil, Aspirin, Garlic, etc.), or if suffering a condition associated with impaired coagulation (example: Hemophilia, cirrhosis of the liver, low platelet counts, etc.). However, even if you do not have one on these, it can still happen. If you have any of these conditions, or take one of these drugs, make sure to notify your treating physician. 3. Infection: This is more common in patients with a compromised immune system, either due to  disease (example: diabetes, cancer, human immunodeficiency virus [HIV], etc.), or due to medications or treatments (example: therapies used to treat cancer and rheumatological diseases). However, even if you do not have one on these, it can still happen. If you have any of these conditions, or take one of these drugs, make sure to notify your treating physician. 4. Nerve Damage: This is more common when the treatment is an invasive one, but it can also happen with the use of medications, such as those used in the treatment of cancer. The damage can occur to small secondary nerves, or to large primary ones, such as those in the spinal cord and brain. This damage may be temporary or permanent and it may lead to impairments that can range from temporary numbness to permanent paralysis and/or brain death. 5. Allergic Reactions: Any time a substance or material comes in contact with our body, there is the possibility of an allergic reaction. These can range from a mild skin rash (contact dermatitis) to a severe systemic reaction (anaphylactic reaction), which can result in death. 6. Death: In general, any medical intervention can result in death, most of the time due to an unforeseen complication. ____________________________________________________________________________________________  ____________________________________________________________________________________________  Genicular Nerve Block  What is a genicular nerve block? A genicular nerve block is the injection of a local anesthetic to block the nerves that transmits pain from the knee.  What is the purpose of a facet nerve block? A genicular nerve block is a diagnostic procedure to determine if the pathologic changes (i.e. arthritis, meniscal tears, etc) and inflammation within the knee joint is the source of your knee pain. It also confirms that the knee pain will respond well to the  actual treatment procedure. If a genicular nerve block  works, it will give you relief for several hours. After that, the pain is expected to return to normal. This test is always performed twice (usually a week or two apart) because two successful tests are required to move onto treatment. If both diagnostic tests are positive, then we schedule a treatment called radiofrequency (RF) ablation. In this procedure, the same nerves are cauterized, which typically leads to pain relief for 4 -18 months. If this process works well for one knee, it can be performed on the other knee if needed.  How is the procedure performed? You will be placed on the procedure table. The injection site is sterilized with either iodine or chlorhexadine. The site to be injected is numbed with a local anesthetic, and a needle is directed to the target area. X-ray guidance is used to ensure proper placement and positioning of the needle. When the needle is properly positioned near the genicular nerve, local anesthetic is injected to numb that nerve. This will be repeated at multiple sites around the knee to block all genicular nerves.  Will the procedure be painful? The injection can be painful and we therefore provide the option of receiving IV sedation. IV sedation, combined with local anesthetic, can make the injection nearly pain free. It allows you to remain very still during the procedure, which can also make the injection easier, faster, and more successful. If you decide to have IV sedation, you must have a driver to get you home safely afterwards. In addition, you cannot have anything to eat or drink within 8 hours of your appointment (clear liquids are allowed until 3 hours before the procedure). If you take medications for diabetes, these medications may need to be adjusted the morning of the procedure. Your primary care physician can help you with this adjustment.  What are the discharge instructions? If you received IV sedation do not drive or operate machinery for at least 24  hours after the procedure. You may return to work the next day following your procedure. You may resume your normal diet immediately. Do not engage in any strenuous activity for 24 hours. You should, however, engage in moderate activity that typically causes your ususal pain. If the block works, those activities should not be painful for several hours after the injection. Do not take a bath, swim, or use a hot tub for 24 hours (you may take a shower). Call the office if you have any of the following: severe pain afterwards (different than your usual symptoms), redness/swelling/discharge at the injection site(s), fevers/chills, difficulty with bowel or bladder functions.  What are the risks and side effects? The complication rate for this procedure is very low. Whenever a needle enters the skin, bleeding or infection can occur. Some other serious but extremely rare risks include paralysis and death. You may have an allergic reaction to any of the medications used. If you have a known allergy to any medications, especially local anesthetics, notify our staff before the procedure takes place. You may experience any of the following side effects up to 4 - 6 hours after the procedure: . Leg muscle weakness or numbness may occur due to the local anesthetic affecting the nerves that control your legs (this is a temporary affect and it is not paralysis). If you have any leg weakness or numbness, walk only with assistance in order to prevent falls and injury. Your leg strength will return slowly and completely. . Dizziness may occur  due to a decrease in your blood pressure. If this occurs, remain in a seated or lying position. Gradually sit up, and then stand after at least 10 minutes of sitting. . Mild headaches may occur. Drink fluids and take pain medications if needed. If the headaches persist or become severe, call the office. . Mild discomfort at the injection site can occur. This typically lasts for a few  hours but can persist for a couple days. If this occurs, take anti-inflammatories or pain medications, apply ice to the area the day of the procedure. If it persists, apply moist heat in the day(s) following.  The side effects listed above can be normal. They are not dangerous and will resolve on their own. If, however, you experience any of the following, a complication may have occurred and you should either contact your doctor. If he is not readily available, then you should proceed to the closest urgent care center for evaluation: . Severe or progressive pain at the injection site(s) . Arm or leg weakness that progressively worsens or persists for longer than 8 hours . Severe or progressive redness, swelling, or discharge from the injections site(s) . Fevers, chills, nausea, or vomiting . Bowel or bladder dysfunction (i.e. inability to urinate or pass stool or difficulty controlling either)  How long does it take for the procedure to work? You should feel relief from your usual pain within the first hour. Again, this is only expected to last for several hours, at the most. Remember, you may be sore in the middle part of your back from the needles, and you must distinguish this from your usual pain. ____________________________________________________________________________________________

## 2018-04-07 ENCOUNTER — Encounter: Payer: Self-pay | Admitting: Pain Medicine

## 2018-04-07 ENCOUNTER — Ambulatory Visit
Admission: RE | Admit: 2018-04-07 | Discharge: 2018-04-07 | Disposition: A | Discharge status: Home / Self Care | Payer: Medicare Other | Source: Ambulatory Visit | Attending: Pain Medicine | Admitting: Pain Medicine

## 2018-04-07 ENCOUNTER — Other Ambulatory Visit: Payer: Self-pay

## 2018-04-07 ENCOUNTER — Ambulatory Visit (HOSPITAL_BASED_OUTPATIENT_CLINIC_OR_DEPARTMENT_OTHER): Payer: Medicare Other | Admitting: Pain Medicine

## 2018-04-07 VITALS — BP 156/65 | HR 60 | Resp 15 | Ht <= 58 in | Wt 110.0 lb

## 2018-04-07 DIAGNOSIS — Z7982 Long term (current) use of aspirin: Secondary | ICD-10-CM | POA: Insufficient documentation

## 2018-04-07 DIAGNOSIS — M25562 Pain in left knee: Secondary | ICD-10-CM | POA: Diagnosis present

## 2018-04-07 DIAGNOSIS — G8929 Other chronic pain: Secondary | ICD-10-CM

## 2018-04-07 DIAGNOSIS — Z888 Allergy status to other drugs, medicaments and biological substances status: Secondary | ICD-10-CM | POA: Insufficient documentation

## 2018-04-07 DIAGNOSIS — Z79899 Other long term (current) drug therapy: Secondary | ICD-10-CM | POA: Diagnosis not present

## 2018-04-07 DIAGNOSIS — M1712 Unilateral primary osteoarthritis, left knee: Secondary | ICD-10-CM | POA: Diagnosis not present

## 2018-04-07 MED ORDER — ROPIVACAINE HCL 2 MG/ML IJ SOLN
9.0000 mL | Freq: Once | INTRAMUSCULAR | Status: AC
  Administered 2018-04-07: 9 mL
  Filled 2018-04-07: qty 10

## 2018-04-07 MED ORDER — LIDOCAINE HCL 2 % IJ SOLN
20.0000 mL | Freq: Once | INTRAMUSCULAR | Status: AC
  Administered 2018-04-07: 400 mg
  Filled 2018-04-07: qty 40

## 2018-04-07 MED ORDER — METHYLPREDNISOLONE ACETATE 80 MG/ML IJ SUSP
80.0000 mg | Freq: Once | INTRAMUSCULAR | Status: AC
  Administered 2018-04-07: 80 mg
  Filled 2018-04-07: qty 1

## 2018-04-07 NOTE — Progress Notes (Signed)
Safety precautions to be maintained throughout the outpatient stay will include: orient to surroundings, keep bed in low position, maintain call bell within reach at all times, provide assistance with transfer out of bed and ambulation.  

## 2018-04-07 NOTE — Progress Notes (Signed)
Patient's Name: Amber Holland  MRN: [id number]  Referring Provider: Lynnea Ferrier, MD  DOB: March 06, 1920  PCP: Lynnea Ferrier, MD  DOS: 04/07/2018  Note by: Oswaldo Done, MD  Service setting: Ambulatory outpatient  Specialty: Interventional Pain Management  Patient type: Established  Location: [facility] (AMB) Pain Management Facility  Visit type: Interventional Procedure   Primary Reason for Visit: Interventional Pain Management Treatment. CC: Knee Pain (left)  Procedure:  Anesthesia, Analgesia, Anxiolysis:  Type: Genicular Nerves Block (Superior-lateral, Superior-medial, and Inferior-medial Genicular Nerves) #1  CPT: 64450      Primary Purpose: Diagnostic Region: Lateral, Anterior, and Medial aspects of the knee joint, above and below the knee joint proper. Level: Superior and inferior to the knee joint. Target Area: For Genicular Nerve block(s), the targets are: the superior-lateral genicular nerve, located in the lateral distal portion of the femoral shaft as it curves to form the lateral epicondyle, in the region of the distal femoral metaphysis; the superior-medial genicular nerve, located in the medial distal portion of the femoral shaft as it curves to form the medial epicondyle; and the inferior-medial genicular nerve, located in the medial, proximal portion of the tibial shaft, as it curves to form the medial epicondyle, in the region of the proximal tibial metaphysis. Approach: Anterior, percutaneous, ipsilateral approach. Laterality: Left knee Position: Modified Fowler's position with pillows under the targeted knee(s).  Type: Local Anesthesia Indication(s): Analgesia         Route: Infiltration (/IM) IV Access: Declined Sedation: Declined  Local Anesthetic: Lidocaine 1-2%   Indications: 1. Osteoarthritis of knee (Left)   2. Chronic knee pain (Primary Area of Pain) (Left)   3. Primary osteoarthritis of left knee   4. Chronic pain of left knee    Pain  Score: Pre-procedure: 5 /10 Post-procedure: 0 /10  Pre-op Assessment:  Amber Holland is a 84 y.o. (year old), female patient, seen today for interventional treatment. She  has a past surgical history that includes No past surgeries and Carpal tunnel release. Amber Holland has a current medication list which includes the following prescription(s): acetaminophen, amlodipine, aspirin ec, candesartan, celecoxib, cyanocobalamin, diclofenac sodium, omeprazole, propafenone, raloxifene, and trospium. Her primarily concern today is the Knee Pain (left)  Initial Vital Signs:  Pulse/HCG Rate: 64  Resp: 18 BP: (!) 178/70 SpO2: 97 %  BMI: Estimated body mass index is 22.99 kg/m as calculated from the following:   Height as of this encounter:  (1.473 m).   Weight as of this encounter: 110 lb (49.9 kg).  Risk Assessment: Allergies: Reviewed. She is allergic to gabapentin.  Allergy Precautions: None required Coagulopathies: Reviewed. None identified.  Blood-thinner therapy: None at this time Active Infection(s): Reviewed. None identified. Amber Holland is afebrile  Site Confirmation: Amber Holland was asked to confirm the procedure and laterality before marking the site Procedure checklist: Completed Consent: Before the procedure and under the influence of no sedative(s), amnesic(s), or anxiolytics, the patient was informed of the treatment options, risks and possible complications. To fulfill our ethical and legal obligations, as recommended by the American Medical Association's Code of Ethics, I have informed the patient of my clinical impression; the nature and purpose of the treatment or procedure; the risks, benefits, and possible complications of the intervention; the alternatives, including doing nothing; the risk(s) and benefit(s) of the alternative treatment(s) or procedure(s); and the risk(s) and benefit(s) of doing nothing. The patient was provided information about the general risks and  possible complications associated with  the procedure. These may include, but are not limited to: failure to achieve desired goals, infection, bleeding, organ or nerve damage, allergic reactions, paralysis, and death. In addition, the patient was informed of those risks and complications associated to the procedure, such as failure to decrease pain; infection; bleeding; organ or nerve damage with subsequent damage to sensory, motor, and/or autonomic systems, resulting in permanent pain, numbness, and/or weakness of one or several areas of the body; allergic reactions; (i.e.: anaphylactic reaction); and/or death. Furthermore, the patient was informed of those risks and complications associated with the medications. These include, but are not limited to: allergic reactions (i.e.: anaphylactic or anaphylactoid reaction(s)); adrenal axis suppression; blood sugar elevation that in diabetics may result in ketoacidosis or comma; water retention that in patients with history of congestive heart failure may result in shortness of breath, pulmonary edema, and decompensation with resultant heart failure; weight gain; swelling or edema; medication-induced neural toxicity; particulate matter embolism and blood vessel occlusion with resultant organ, and/or nervous system infarction; and/or aseptic necrosis of one or more joints. Finally, the patient was informed that Medicine is not an exact science; therefore, there is also the possibility of unforeseen or unpredictable risks and/or possible complications that may result in a catastrophic outcome. The patient indicated having understood very clearly. We have given the patient no guarantees and we have made no promises. Enough time was given to the patient to ask questions, all of which were answered to the patient's satisfaction. Amber Holland has indicated that she wanted to continue with the procedure. Attestation: I, the ordering provider, attest that I have discussed with  the patient the benefits, risks, side-effects, alternatives, likelihood of achieving goals, and potential problems during recovery for the procedure that I have provided informed consent. Date  Time: 04/07/2018 12:26 PM  Pre-Procedure Preparation:  Monitoring: As per clinic protocol. Respiration, ETCO2, SpO2, BP, heart rate and rhythm monitor placed and checked for adequate function Safety Precautions: Patient was assessed for positional comfort and pressure points before starting the procedure. Time-out: I initiated and conducted the "Time-out" before starting the procedure, as per protocol. The patient was asked to participate by confirming the accuracy of the "Time Out" information. Verification of the correct person, site, and procedure were performed and confirmed by me, the nursing staff, and the patient. "Time-out" conducted as per Joint Commission's Universal Protocol (UP.01.01.01). Time: 1257  Description of Procedure Process:  Area Prepped: Entire knee area, from mid-thigh to mid-shin, lateral, anterior, and medial aspects. Prepping solution: ChloraPrep (2% chlorhexidine gluconate and 70% isopropyl alcohol) Safety Precautions: Aspiration looking for blood return was conducted prior to all injections. At no point did we inject any substances, as a needle was being advanced. No attempts were made at seeking any paresthesias. Safe injection practices and needle disposal techniques used. Medications properly checked for expiration dates. SDV (single dose vial) medications used. Latex Allergy precautions taken.   Description of the Procedure: Protocol guidelines were followed. The patient was placed in position over the procedure table. The target area was identified and the area prepped in the usual manner. Skin desensitized using vapocoolant spray. Skin & deeper tissues infiltrated with local anesthetic. Appropriate amount of time allowed to pass for local anesthetics to take effect. The procedure  needles were then advanced to the target area. Proper needle placement secured. Negative aspiration confirmed. Solution injected in intermittent fashion, asking for systemic symptoms every 0.5cc of injectate. The needles were then removed and the area cleansed, making sure to leave  some of the prepping solution back to take advantage of its long term bactericidal properties.  Vitals:   04/07/18 1225 04/07/18 1255 04/07/18 1300 04/07/18 1310  BP: (!) 178/70 (!) 150/84 (!) 164/62 (!) 156/65  Pulse: 64 (!) 59 (!) 59 60  Resp: SpO2: 97% 97% 99% 99%  Weight: 110 lb (49.9 kg)     Height:  (1.473 m)       Start Time: 1257 hrs. End Time: 1302 hrs. Materials:  Needle(s) Type: Regular needle Gauge: 25G Length: 1.5-in Medication(s): Please see orders for medications and dosing details.  Imaging Guidance (Non-Spinal):  Type of Imaging Technique: Fluoroscopy Guidance (Non-Spinal) Indication(s): Assistance in needle guidance and placement for procedures requiring needle placement in or near specific anatomical locations not easily accessible without such assistance. Exposure Time: Please see nurses notes. Contrast: Before injecting any contrast, we confirmed that the patient did not have an allergy to iodine, shellfish, or radiological contrast. Once satisfactory needle placement was completed at the desired level, radiological contrast was injected. Contrast injected under live fluoroscopy. No contrast complications. See chart for type and volume of contrast used. Fluoroscopic Guidance: I was personally present during the use of fluoroscopy. "Tunnel Vision Technique" used to obtain the best possible view of the target area. Parallax error corrected before commencing the procedure. "Direction-depth-direction" technique used to introduce the needle under continuous pulsed fluoroscopy. Once target was reached, antero-posterior, oblique, and lateral fluoroscopic projection used confirm  needle placement in all planes. Images permanently stored in EMR. Interpretation: I personally interpreted the imaging intraoperatively. Adequate needle placement confirmed in multiple planes. Appropriate spread of contrast into desired area was observed. No evidence of afferent or efferent intravascular uptake. Permanent images saved into the patient's record.  Antibiotic Prophylaxis:   Anti-infectives (From admission, onward)   None     Indication(s): None identified  Post-operative Assessment:  Post-procedure Vital Signs:  Pulse/HCG Rate: 60  Resp: 15 BP: (!) 156/65 SpO2: 99 %  EBL: None  Complications: No immediate post-treatment complications observed by team, or reported by patient.  Note: The patient tolerated the entire procedure well. A repeat set of vitals were taken after the procedure and the patient was kept under observation following institutional policy, for this type of procedure. Post-procedural neurological assessment was performed, showing return to baseline, prior to discharge. The patient was provided with post-procedure discharge instructions, including a section on how to identify potential problems. Should any problems arise concerning this procedure, the patient was given instructions to immediately contact us, at any time, without hesitation. In any case, we plan to contact the patient by telephone for a follow-up status report regarding this interventional procedure.  Comments:  No additional relevant information.  Plan of Care    Imaging Orders     DG C-Arm 1-60 Min-No Report  Procedure Orders     GENICULAR NERVE BLOCK  Medications ordered for procedure: Meds ordered this encounter  Medications  . lidocaine (XYLOCAINE) 2 % (with pres) injection 400 mg  . methylPREDNISolone acetate (DEPO-MEDROL) injection 80 mg  . ropivacaine (PF) 2 mg/mL (0.2%) (NAROPIN) injection 9 mL   Medications administered: We administered lidocaine, methylPREDNISolone  acetate, and ropivacaine (PF) 2 mg/mL (0.2%).  See the medical record for exact dosing, route, and time of administration.  New Prescriptions   No medications on file   Disposition: Discharge home  Discharge Date & Time: 04/07/2018; 1312 hrs.   Physician-requested Follow-up: Return for post-procedure eval (2 wks), w/  Dr. Laban Emperor.  Future Appointments  Date Time Provider Department Center  04/20/2018  2:00 PM Delano Metz, MD [facility] None  08/09/2018  9:45 AM Hildred Laser, MD [facility] None   Primary Care Physician: Lynnea Ferrier, MD Location: [facility] Note by: Oswaldo Done, MD Date: 04/07/2018; Time: 3:36 PM  Disclaimer:  Medicine is not an Visual merchandiser. The only guarantee in medicine is that nothing is guaranteed. It is important to note that the decision to proceed with this intervention was based on the information collected from the patient. The Data and conclusions were drawn from the patient's questionnaire, the interview, and the physical examination. Because the information was provided in large part by the patient, it cannot be guaranteed that it has not been purposely or unconsciously manipulated. Every effort has been made to obtain as much relevant data as possible for this evaluation. It is important to note that the conclusions that lead to this procedure are derived in large part from the available data. Always take into account that the treatment will also be dependent on availability of resources and existing treatment guidelines, considered by other Pain Management Practitioners as being common knowledge and practice, at the time of the intervention. For Medico-Legal purposes, it is also important to point out that variation in procedural techniques and pharmacological choices are the acceptable norm. The indications, contraindications, technique, and results of the above procedure should only be interpreted and judged by a  Board-Certified Interventional Pain Specialist with extensive familiarity and expertise in the same exact procedure and technique.

## 2018-04-07 NOTE — Patient Instructions (Addendum)
____________________________________________________________________________________________  Post-Procedure Discharge Instructions  Instructions:  Apply ice: Fill a plastic sandwich bag with crushed ice. Cover it with a small towel and apply to injection site. Apply for 15 minutes then remove x 15 minutes. Repeat sequence on day of procedure, until you go to bed. The purpose is to minimize swelling and discomfort after procedure.  Apply heat: Apply heat to procedure site starting the day following the procedure. The purpose is to treat any soreness and discomfort from the procedure.  Food intake: Start with clear liquids (like water) and advance to regular food, as tolerated.   Physical activities: Keep activities to a minimum for the first 8 hours after the procedure.   Driving: If you have received any sedation, you are not allowed to drive for 24 hours after your procedure.  Blood thinner: Restart your blood thinner 6 hours after your procedure. (Only for those taking blood thinners)  Insulin: As soon as you can eat, you may resume your normal dosing schedule. (Only for those taking insulin)  Infection prevention: Keep procedure site clean and dry.  Post-procedure Pain Diary: Extremely important that this be done correctly and accurately. Recorded information will be used to determine the next step in treatment.  Pain evaluated is that of treated area only. Do not include pain from an untreated area.  Complete every hour, on the hour, for the initial 8 hours. Set an alarm to help you do this part accurately.  Do not go to sleep and have it completed later. It will not be accurate.  Follow-up appointment: Keep your follow-up appointment after the procedure. Usually 2 weeks for most procedures. (6 weeks in the case of radiofrequency.) Bring you pain diary.   Expect:  From numbing medicine (AKA: Local Anesthetics): Numbness or decrease in pain.  Onset: Full effect within 15  minutes of injected.  Duration: It will depend on the type of local anesthetic used. On the average, 1 to 8 hours.   From steroids: Decrease in swelling or inflammation. Once inflammation is improved, relief of the pain will follow.  Onset of benefits: Depends on the amount of swelling present. The more swelling, the longer it will take for the benefits to be seen. In some cases, up to 10 days.  Duration: Steroids will stay in the system x 2 weeks. Duration of benefits will depend on multiple posibilities including persistent irritating factors.  From procedure: Some discomfort is to be expected once the numbing medicine wears off. This should be minimal if ice and heat are applied as instructed.  Call if:  You experience numbness and weakness that gets worse with time, as opposed to wearing off.  New onset bowel or bladder incontinence. (This applies to Spinal procedures only)  Emergency Numbers:  Durning business hours (Monday - Thursday, 8:00 AM - 4:00 PM) (Friday, 9:00 AM - 12:00 Noon): [phone]  After hours: [phone] ____________________________________________________________________________________________    Knee Injection A knee injection is a procedure to get medicine into your knee joint. Your health care provider puts a needle into the joint and injects medicine with an attached syringe. The injected medicine may relieve the pain, swelling, and stiffness of arthritis. The injected medicine may also help to lubricate and cushion your knee joint. You may need more than one injection. Tell a health care provider about:  Any allergies you have.  All medicines you are taking, including vitamins, herbs, eye drops, creams, and over-the-counter medicines.  Any problems you or family members have   had with anesthetic medicines.  Any blood disorders you have.  Any surgeries you have had.  Any medical conditions you have. What are the risks? Generally, this is a  safe procedure. However, problems may occur, including:  Infection.  Bleeding.  Worsening symptoms.  Damage to the area around your knee.  Allergic reaction to any of the medicines.  Skin reactions from repeated injections.  What happens before the procedure?  Ask your health care provider about changing or stopping your regular medicines. This is especially important if you are taking diabetes medicines or blood thinners.  Plan to have someone take you home after the procedure. What happens during the procedure?  You will sit or lie down in a position for your knee to be treated.  The skin over your kneecap will be cleaned with a germ-killing solution (antiseptic).  You will be given a medicine that numbs the area (local anesthetic). You may feel some stinging.  After your knee becomes numb, you will have a second injection. This is the medicine. This needle is carefully placed between your kneecap and your knee. The medicine is injected into the joint space.  At the end of the procedure, the needle will be removed.  A bandage (dressing) may be placed over the injection site. The procedure may vary among health care providers and hospitals. What happens after the procedure?  You may have to move your knee through its full range of motion. This helps to get all of the medicine into your joint space.  Your blood pressure, heart rate, breathing rate, and blood oxygen level will be monitored often until the medicines you were given have worn off.  You will be watched to make sure that you do not have a reaction to the injected medicine. This information is not intended to replace advice given to you by your health care provider. Make sure you discuss any questions you have with your health care provider. Document Released: 02/07/2007 Document Revised: 04/17/2016 Document Reviewed: 09/26/2014 Elsevier Interactive Patient Education  2018 [employer]  

## 2018-04-08 ENCOUNTER — Telehealth: Payer: Self-pay | Admitting: Pain Medicine

## 2018-04-08 ENCOUNTER — Telehealth: Payer: Self-pay

## 2018-04-08 LAB — COMP. METABOLIC PANEL (12)
AST: 9 IU/L (ref 0–40)
Albumin/Globulin Ratio: 1.5 (ref 1.2–2.2)
Albumin: 3.8 g/dL (ref 3.2–4.6)
Alkaline Phosphatase: 62 IU/L (ref 39–117)
BUN/Creatinine Ratio: 41 — ABNORMAL HIGH (ref 12–28)
BUN: 27 mg/dL (ref 10–36)
Bilirubin Total: 0.3 mg/dL (ref 0.0–1.2)
CALCIUM: 8.8 mg/dL (ref 8.7–10.3)
CREATININE: 0.66 mg/dL (ref 0.57–1.00)
Chloride: 105 mmol/L (ref 96–106)
GFR calc non Af Amer: 75 mL/min/{1.73_m2} (ref >59)
GFR, EST AFRICAN AMERICAN: 87 mL/min/{1.73_m2} (ref >59)
Globulin, Total: 2.5 g/dL (ref 1.5–4.5)
Glucose: 102 mg/dL — ABNORMAL HIGH (ref 65–99)
Potassium: 4.1 mmol/L (ref 3.5–5.2)
Sodium: 143 mmol/L (ref 134–144)
TOTAL PROTEIN: 6.3 g/dL (ref 6.0–8.5)

## 2018-04-08 LAB — C-REACTIVE PROTEIN: CRP: 1.8 mg/L (ref 0.0–4.9)

## 2018-04-08 LAB — SEDIMENTATION RATE: SED RATE: 10 mm/h (ref 0–40)

## 2018-04-08 LAB — 25-HYDROXYVITAMIN D LCMS D2+D3
25-HYDROXY, VITAMIN D-2: 1.3 ng/mL
25-HYDROXY, VITAMIN D: 16 ng/mL — AB

## 2018-04-08 LAB — VITAMIN B12: Vitamin B-12: 1684 pg/mL — ABNORMAL HIGH (ref 232–1245)

## 2018-04-08 LAB — 25-HYDROXY VITAMIN D LCMS D2+D3: 25-Hydroxy, Vitamin D-3: 15 ng/mL

## 2018-04-08 LAB — MAGNESIUM: MAGNESIUM: 2.1 mg/dL (ref 1.6–2.3)

## 2018-04-08 NOTE — Telephone Encounter (Signed)
error 

## 2018-04-08 NOTE — Telephone Encounter (Signed)
Post procedure phone call.  Spoke with son in law and he states that she is doing very well and is getting her hair done.

## 2018-04-20 ENCOUNTER — Ambulatory Visit: Payer: Medicare Other | Admitting: Pain Medicine

## 2018-04-30 DIAGNOSIS — I639 Cerebral infarction, unspecified: Secondary | ICD-10-CM

## 2018-04-30 HISTORY — DX: Cerebral infarction, unspecified: I63.9

## 2018-05-01 NOTE — Progress Notes (Signed)
Patient's Name: Amber Holland  MRN: [id number]  Referring Provider: Adin Hector, MD  DOB: [birth year]  PCP: Adin Hector, MD  DOS: 05/02/2018  Note by: Gaspar Cola, MD  Service setting: Ambulatory outpatient  Specialty: Interventional Pain Management  Location: [facility] (AMB) Pain Management Facility    Patient type: Established   Primary Reason(s) for Visit: Encounter for post-procedure evaluation of chronic illness with mild to moderate exacerbation CC: Knee Pain (left)  HPI  Amber Holland is a 84 y.o. year old, female patient, who comes today for a post-procedure evaluation. She has Atrial fibrillation ([place]); B12 deficiency; Bilateral carpal tunnel syndrome; Benign essential hypertension; Hereditary and idiopathic neuropathy; Urinary incontinence; Cystocele, midline; Vaginal pessary in situ; Peripheral polyneuropathy; Chronic knee pain (Primary Area of Pain) (Left); Chronic pain syndrome; Pharmacologic therapy; Disorder of skeletal system; Problems influencing health status; and Osteoarthritis of knee (Left) on their problem list. Her primarily concern today is the Knee Pain (left)  Pain Assessment: Location: Left Knee Radiating: Denies Onset: More than a month ago Duration: Chronic pain Quality: Discomfort Severity: 0-No pain/10 (subjective, self-reported pain score)  Note: Reported level is compatible with observation.                         When using our objective Pain Scale, levels between 6 and 10/10 are said to belong in an emergency room, as it progressively worsens from a 6/10, described as severely limiting, requiring emergency care not usually available at an outpatient pain management facility. At a 6/10 level, communication becomes difficult and requires great effort. Assistance to reach the emergency department may be required. Facial flushing and profuse sweating along with potentially dangerous increases in heart rate and blood pressure will be  evident. Effect on ADL: Pain while walking Timing: Intermittent Modifying factors: rest BP: 117/61  HR: 66  Amber Holland comes in today for post-procedure evaluation after the treatment done on 04/08/2018.  Further details on both, my assessment(s), as well as the proposed treatment plan, please see below.  Post-Procedure Assessment  04/08/2018 Procedure: Diagnostic left knee genicular nerve block #1  under fluoroscopic guidance, no sedation Pre-procedure pain score:  5/10 Post-procedure pain score: 0/10 (100% relief) Influential Factors: BMI: 22.99 kg/m Intra-procedural challenges: None observed.         Assessment challenges: The patient is an extremely poor historian with some degree of senile dimension.              Reported side-effects: None.        Post-procedural adverse reactions or complications: None reported         Sedation: No sedation used. When no sedatives are used, the analgesic levels obtained are directly associated to the effectiveness of the local anesthetics. However, when sedation is provided, the level of analgesia obtained during the initial 1 hour following the intervention, is believed to be the result of a combination of factors. These factors may include, but are not limited to: 1. The effectiveness of the local anesthetics used. 2. The effects of the analgesic(s) and/or anxiolytic(s) used. 3. The degree of discomfort experienced by the patient at the time of the procedure. 4. The patients ability and reliability in recalling and recording the events. 5. The presence and influence of possible secondary gains and/or psychosocial factors. Reported result: Relief experienced during the 1st hour after the procedure: 100 % (Ultra-Short Term Relief)  Interpretative annotation: Clinically appropriate result. No IV Analgesic or Anxiolytic given, therefore benefits are completely due to Local Anesthetic effects.          Effects of local anesthetic: The  analgesic effects attained during this period are directly associated to the localized infiltration of local anesthetics and therefore cary significant diagnostic value as to the etiological location, or anatomical origin, of the pain. Expected duration of relief is directly dependent on the pharmacodynamics of the local anesthetic used. Long-acting (4-6 hours) anesthetics used.  Reported result: Relief during the next 4 to 6 hour after the procedure: 50 % (Short-Term Relief)            Interpretative annotation: Clinically appropriate result. Analgesia during this period is likely to be Local Anesthetic-related.          Long-term benefit: Defined as the period of time past the expected duration of local anesthetics (1 hour for short-acting and 4-6 hours for long-acting). With the possible exception of prolonged sympathetic blockade from the local anesthetics, benefits during this period are typically attributed to, or associated with, other factors such as analgesic sensory neuropraxia, antiinflammatory effects, or beneficial biochemical changes provided by agents other than the local anesthetics.  Reported result: Extended relief following procedure: 0 % (Long-Term Relief)            Interpretative annotation: Clinically appropriate result. Good relief. No permanent benefit expected. Inflammation plays a part in the etiology to the pain.          Current benefits: Defined as reported results that persistent at this point in time.   Analgesia: 0-25 % Amber Holland reports improvement of arthralgia. Function: Somewhat improved ROM: Somewhat improved Interpretative annotation: Recurrence of symptoms. Limited therapeutic benefit. Results would suggest persistent aggravating factors.          Interpretation: Results would suggest a successful diagnostic intervention.                  Plan:  Proceed with Radiofrequency Ablation for the purpose of attaining long-term benefits.                Laboratory  Chemistry  Inflammation Markers (CRP: Acute Phase) (ESR: Chronic Phase) Lab Results  Component Value Date   CRP 1.8 03/31/2018   ESRSEDRATE 10 03/31/2018                         Renal Markers Lab Results  Component Value Date   BUN 27 03/31/2018   CREATININE 0.66 03/31/2018   BCR 41 (H) 03/31/2018   GFRAA 87 03/31/2018   GFRNONAA 75 03/31/2018                             Hepatic Markers Lab Results  Component Value Date   AST 9 03/31/2018   ALT 14 04/18/2014   ALBUMIN 3.8 03/31/2018                        Hematology Parameters Lab Results  Component Value Date   PLT 323 10/06/2017   HGB 12.3 10/06/2017   HCT 37.6 10/06/2017                        CV Markers Lab Results  Component Value Date   TROPONINI <0.03 10/06/2017  Note: Lab results reviewed.  Recent Diagnostic Imaging Results  DG C-Arm 1-60 Min-No Report Fluoroscopy was utilized by the requesting physician.  No radiographic  interpretation.   Complexity Note: I personally reviewed the fluoroscopic imaging of the procedure.                        Meds   Current Outpatient Medications:  .  acetaminophen (TYLENOL) 325 MG tablet, Take 650 mg by mouth., Disp: , Rfl:  .  amLODipine (NORVASC) 5 MG tablet, TAKE ONE-HALF (1/2) TABLET DAILY, Disp: , Rfl:  .  aspirin EC 81 MG tablet, Take by mouth., Disp: , Rfl:  .  candesartan (ATACAND) 32 MG tablet, TAKE 1 TABLET DAILY, Disp: , Rfl:  .  celecoxib (CELEBREX) 200 MG capsule, , Disp: , Rfl:  .  Cyanocobalamin (VITAMIN B-12 PO), Take by mouth., Disp: , Rfl:  .  diclofenac sodium (VOLTAREN) 1 % GEL, Apply topically., Disp: , Rfl:  .  omeprazole (PRILOSEC) 40 MG capsule, TAKE 1 CAPSULE DAILY, Disp: , Rfl:  .  propafenone (RYTHMOL) 225 MG tablet, TAKE 1 TABLET BY MOUTH TWICE A DAY, Disp: , Rfl:  .  raloxifene (EVISTA) 60 MG tablet, TAKE 1 TABLET DAILY, Disp: , Rfl:  .  trospium (SANCTURA) 20 MG tablet, TAKE 1 TABLET TWICE A DAY, Disp: 180  tablet, Rfl: 3  ROS  Constitutional: Denies any fever or chills Gastrointestinal: No reported hemesis, hematochezia, vomiting, or acute GI distress Musculoskeletal: Denies any acute onset joint swelling, redness, loss of ROM, or weakness Neurological: No reported episodes of acute onset apraxia, aphasia, dysarthria, agnosia, amnesia, paralysis, loss of coordination, or loss of consciousness  Allergies  Ms. Reggio is allergic to gabapentin.  [place]  Drug: Ms. Buser  reports that she does not use drugs. Alcohol:  reports that she drinks alcohol. Tobacco:  reports that she has never smoked. She has never used smokeless tobacco. Medical:  has a past medical history of AF (atrial fibrillation) ([place]), Arthritis, Constipation, Cystocele, GERD (gastroesophageal reflux disease), Hypertension, Nocturia, Osteoporosis, Urethral caruncle, Urinary incontinence, and Vaginal atrophy. Surgical: Ms. Warr  has a past surgical history that includes No past surgeries and Carpal tunnel release. Family: family history is not on file.  Constitutional Exam  General appearance: Well nourished, well developed, and well hydrated. In no apparent acute distress Vitals:   05/02/18 0903  BP: 117/61  Pulse: 66  Temp: 97.9 F (36.6 C)  SpO2: 96%  Weight: 110 lb (49.9 kg)  Height: '4\' 10"'$  (1.473 m)   BMI Assessment: Estimated body mass index is 22.99 kg/m as calculated from the following:   Height as of this encounter: '4\' 10"'$  (1.473 m).   Weight as of this encounter: 110 lb (49.9 kg).  BMI interpretation table: BMI level Category Range association with higher incidence of chronic pain  <18 kg/m2 Underweight   18.5-24.9 kg/m2 Ideal body weight   25-29.9 kg/m2 Overweight Increased incidence by 20%  30-34.9 kg/m2 Obese (Class I) Increased incidence by 68%  35-39.9 kg/m2 Severe obesity (Class II) Increased incidence by 136%  >40 kg/m2 Extreme obesity (Class III) Increased incidence by 254%   Patient's  current BMI Ideal Body weight  Body mass index is 22.99 kg/m. Patient must be at least 58 in tall to calculate ideal body weight   BMI Readings from Last 4 Encounters:  05/02/18 22.99 kg/m  04/07/18 22.99 kg/m  04/04/18 22.99 kg/m  03/31/18 22.99 kg/m   Wt Readings  from Last 4 Encounters:  05/02/18 110 lb (49.9 kg)  04/07/18 110 lb (49.9 kg)  04/04/18 110 lb (49.9 kg)  03/31/18 110 lb (49.9 kg)  Psych/Mental status: Alert, oriented x 3 (person, place, & time)       Eyes: PERLA Respiratory: No evidence of acute respiratory distress  Cervical Spine Area Exam  Skin & Axial Inspection: No masses, redness, edema, swelling, or associated skin lesions Alignment: Symmetrical Functional ROM: Unrestricted ROM      Stability: No instability detected Muscle Tone/Strength: Functionally intact. No obvious neuro-muscular anomalies detected. Sensory (Neurological): Unimpaired Palpation: No palpable anomalies              Upper Extremity (UE) Exam    Side: Right upper extremity  Side: Left upper extremity  Skin & Extremity Inspection: Skin color, temperature, and hair growth are WNL. No peripheral edema or cyanosis. No masses, redness, swelling, asymmetry, or associated skin lesions. No contractures.  Skin & Extremity Inspection: Skin color, temperature, and hair growth are WNL. No peripheral edema or cyanosis. No masses, redness, swelling, asymmetry, or associated skin lesions. No contractures.  Functional ROM: Unrestricted ROM          Functional ROM: Unrestricted ROM          Muscle Tone/Strength: Functionally intact. No obvious neuro-muscular anomalies detected.  Muscle Tone/Strength: Functionally intact. No obvious neuro-muscular anomalies detected.  Sensory (Neurological): Unimpaired          Sensory (Neurological): Unimpaired          Palpation: No palpable anomalies              Palpation: No palpable anomalies              Provocative Test(s):  Phalen's test: deferred Tinel's test:  deferred Apley's scratch test (touch opposite shoulder):  Action 1 (Across chest): deferred Action 2 (Overhead): deferred Action 3 (LB reach): deferred   Provocative Test(s):  Phalen's test: deferred Tinel's test: deferred Apley's scratch test (touch opposite shoulder):  Action 1 (Across chest): deferred Action 2 (Overhead): deferred Action 3 (LB reach): deferred    Thoracic Spine Area Exam  Skin & Axial Inspection: No masses, redness, or swelling Alignment: Symmetrical Functional ROM: Unrestricted ROM Stability: No instability detected Muscle Tone/Strength: Functionally intact. No obvious neuro-muscular anomalies detected. Sensory (Neurological): Unimpaired Muscle strength & Tone: No palpable anomalies  Lumbar Spine Area Exam  Skin & Axial Inspection: No masses, redness, or swelling Alignment: Symmetrical Functional ROM: Unrestricted ROM       Stability: No instability detected Muscle Tone/Strength: Functionally intact. No obvious neuro-muscular anomalies detected. Sensory (Neurological): Unimpaired Palpation: No palpable anomalies       Provocative Tests: Lumbar Hyperextension/rotation test: deferred today       Lumbar quadrant test (Kemp's test): deferred today       Lumbar Lateral bending test: deferred today       Patrick's Maneuver: deferred today                   FABER test: deferred today       Thigh-thrust test: deferred today       S-I compression test: deferred today       S-I distraction test: deferred today        Gait & Posture Assessment  Ambulation: Patient came in today in a wheel chair Gait: Significantly limited. Dependent on assistive device to ambulate Posture: Difficulty with positional changes   Lower Extremity Exam    Side: Right lower  extremity  Side: Left lower extremity  Stability: No instability observed          Stability: No instability observed          Skin & Extremity Inspection: Skin color, temperature, and hair growth are WNL. No  peripheral edema or cyanosis. No masses, redness, swelling, asymmetry, or associated skin lesions. No contractures.  Skin & Extremity Inspection: Skin color, temperature, and hair growth are WNL. No peripheral edema or cyanosis. No masses, redness, swelling, asymmetry, or associated skin lesions. No contractures.  Functional ROM: Unrestricted ROM                  Functional ROM: Unrestricted ROM                  Muscle Tone/Strength: Functionally intact. No obvious neuro-muscular anomalies detected.  Muscle Tone/Strength: Functionally intact. No obvious neuro-muscular anomalies detected.  Sensory (Neurological): Unimpaired  Sensory (Neurological): Unimpaired  Palpation: No palpable anomalies  Palpation: No palpable anomalies   Assessment  Primary Diagnosis & Pertinent Problem List: The primary encounter diagnosis was Chronic knee pain (Primary Area of Pain) (Left). Diagnoses of Osteoarthritis of knee (Left) and Chronic pain syndrome were also pertinent to this visit.  Status Diagnosis  Recurring Persistent Persistent 1. Chronic knee pain (Primary Area of Pain) (Left)   2. Osteoarthritis of knee (Left)   3. Chronic pain syndrome     Problems updated and reviewed during this visit: No problems updated. Plan of Care  Pharmacotherapy (Medications Ordered): No orders of the defined types were placed in this encounter.  Medications administered today: Teneka L. Schatz had no medications administered during this visit.   Procedure Orders     Radiofrequency,Genicular Lab Orders  No laboratory test(s) ordered today   Imaging Orders  No imaging studies ordered today   Referral Orders  No referral(s) requested today    Interventional management options: Planned, scheduled, and/or pending:   TherapeuticLeft Genicular RFA #1  under fluoroscopic guidance and limited sedation    Considering:   Diagnostic Left Genicular Nerve Block #2  PossibleLeft Genicular RFA    Palliative  PRN treatment(s):   None at this time   Provider-requested follow-up: Return for RFA (fluoro + sedation): (L) Genicular Nerve RFA.  Future Appointments  Date Time Provider [place]  08/09/2018  9:45 AM Rubie Maid, MD [facility] None   Primary Care Physician: Adin Hector, MD Location: [facility] Note by: Gaspar Cola, MD Date: 05/02/2018; Time: 2:33 PM

## 2018-05-02 ENCOUNTER — Encounter: Payer: Self-pay | Admitting: Pain Medicine

## 2018-05-02 ENCOUNTER — Other Ambulatory Visit: Payer: Self-pay

## 2018-05-02 ENCOUNTER — Ambulatory Visit: Payer: Medicare Other | Attending: Pain Medicine | Admitting: Pain Medicine

## 2018-05-02 VITALS — BP 117/61 | HR 66 | Temp 97.9°F | Ht <= 58 in | Wt 110.0 lb

## 2018-05-02 DIAGNOSIS — E538 Deficiency of other specified B group vitamins: Secondary | ICD-10-CM | POA: Insufficient documentation

## 2018-05-02 DIAGNOSIS — G8929 Other chronic pain: Secondary | ICD-10-CM

## 2018-05-02 DIAGNOSIS — Z888 Allergy status to other drugs, medicaments and biological substances status: Secondary | ICD-10-CM | POA: Insufficient documentation

## 2018-05-02 DIAGNOSIS — M25562 Pain in left knee: Secondary | ICD-10-CM | POA: Diagnosis not present

## 2018-05-02 DIAGNOSIS — Z79899 Other long term (current) drug therapy: Secondary | ICD-10-CM | POA: Diagnosis not present

## 2018-05-02 DIAGNOSIS — G5603 Carpal tunnel syndrome, bilateral upper limbs: Secondary | ICD-10-CM | POA: Diagnosis not present

## 2018-05-02 DIAGNOSIS — G894 Chronic pain syndrome: Secondary | ICD-10-CM | POA: Diagnosis not present

## 2018-05-02 DIAGNOSIS — K219 Gastro-esophageal reflux disease without esophagitis: Secondary | ICD-10-CM | POA: Diagnosis not present

## 2018-05-02 DIAGNOSIS — Z7982 Long term (current) use of aspirin: Secondary | ICD-10-CM | POA: Diagnosis not present

## 2018-05-02 DIAGNOSIS — I10 Essential (primary) hypertension: Secondary | ICD-10-CM | POA: Diagnosis not present

## 2018-05-02 DIAGNOSIS — I4891 Unspecified atrial fibrillation: Secondary | ICD-10-CM | POA: Insufficient documentation

## 2018-05-02 DIAGNOSIS — M1712 Unilateral primary osteoarthritis, left knee: Secondary | ICD-10-CM | POA: Diagnosis not present

## 2018-05-02 NOTE — Patient Instructions (Addendum)
____________________________________________________________________________________________  Preparing for Procedure with Sedation  Instructions: . Oral Intake: Do not eat or drink anything for at least 8 hours prior to your procedure. . Transportation: Public transportation is not allowed. Bring an adult driver. The driver must be physically present in our waiting room before any procedure can be started. . Physical Assistance: Bring an adult physically capable of assisting you, in the event you need help. This adult should keep you company at home for at least 6 hours after the procedure. . Blood Pressure Medicine: Take your blood pressure medicine with a sip of water the morning of the procedure. . Blood thinners:  . Diabetics on insulin: Notify the staff so that you can be scheduled 1st case in the morning. If your diabetes requires high dose insulin, take only  of your normal insulin dose the morning of the procedure and notify the staff that you have done so. . Preventing infections: Shower with an antibacterial soap the morning of your procedure. . Build-up your immune system: Take 1000 mg of Vitamin C with every meal (3 times a day) the day prior to your procedure. . Antibiotics: Inform the staff if you have a condition or reason that requires you to take antibiotics before dental procedures. . Pregnancy: If you are pregnant, call and cancel the procedure. . Sickness: If you have a cold, fever, or any active infections, call and cancel the procedure. . Arrival: You must be in the facility at least 30 minutes prior to your scheduled procedure. . Children: Do not bring children with you. . Dress appropriately: Bring dark clothing that you would not mind if they get stained. . Valuables: Do not bring any jewelry or valuables.  Procedure appointments are reserved for interventional treatments only. . No Prescription Refills. . No medication changes will be discussed during procedure  appointments. . No disability issues will be discussed.  Remember:  Regular Business hours are:  Monday to Thursday 8:00 AM to 4:00 PM  Provider's Schedule: Darla Mcdonald, MD:  Procedure days: Tuesday and Thursday 7:30 AM to 4:00 PM  Bilal Lateef, MD:  Procedure days: Monday and Wednesday 7:30 AM to 4:00 PM ____________________________________________________________________________________________  ____________________________________________________________________________________________  General Risks and Possible Complications  Patient Responsibilities: It is important that you read this as it is part of your informed consent. It is our duty to inform you of the risks and possible complications associated with treatments offered to you. It is your responsibility as a patient to read this and to ask questions about anything that is not clear or that you believe was not covered in this document.  Patient's Rights: You have the right to refuse treatment. You also have the right to change your mind, even after initially having agreed to have the treatment done. However, under this last option, if you wait until the last second to change your mind, you may be charged for the materials used up to that point.  Introduction: Medicine is not an exact science. Everything in Medicine, including the lack of treatment(s), carries the potential for danger, harm, or loss (which is by definition: Risk). In Medicine, a complication is a secondary problem, condition, or disease that can aggravate an already existing one. All treatments carry the risk of possible complications. The fact that a side effects or complications occurs, does not imply that the treatment was conducted incorrectly. It must be clearly understood that these can happen even when everything is done following the highest safety standards.  No treatment: You   can choose not to proceed with the proposed treatment alternative. The  "PRO(s)" would include: avoiding the risk of complications associated with the therapy. The "CON(s)" would include: not getting any of the treatment benefits. These benefits fall under one of three categories: diagnostic; therapeutic; and/or palliative. Diagnostic benefits include: getting information which can ultimately lead to improvement of the disease or symptom(s). Therapeutic benefits are those associated with the successful treatment of the disease. Finally, palliative benefits are those related to the decrease of the primary symptoms, without necessarily curing the condition (example: decreasing the pain from a flare-up of a chronic condition, such as incurable terminal cancer).  General Risks and Complications: These are associated to most interventional treatments. They can occur alone, or in combination. They fall under one of the following six (6) categories: no benefit or worsening of symptoms; bleeding; infection; nerve damage; allergic reactions; and/or death. 1. No benefits or worsening of symptoms: In Medicine there are no guarantees, only probabilities. No healthcare provider can ever guarantee that a medical treatment will work, they can only state the probability that it may. Furthermore, there is always the possibility that the condition may worsen, either directly, or indirectly, as a consequence of the treatment. 2. Bleeding: This is more common if the patient is taking a blood thinner, either prescription or over the counter (example: Goody Powders, Fish oil, Aspirin, Garlic, etc.), or if suffering a condition associated with impaired coagulation (example: Hemophilia, cirrhosis of the liver, low platelet counts, etc.). However, even if you do not have one on these, it can still happen. If you have any of these conditions, or take one of these drugs, make sure to notify your treating physician. 3. Infection: This is more common in patients with a compromised immune system, either due to  disease (example: diabetes, cancer, human immunodeficiency virus [HIV], etc.), or due to medications or treatments (example: therapies used to treat cancer and rheumatological diseases). However, even if you do not have one on these, it can still happen. If you have any of these conditions, or take one of these drugs, make sure to notify your treating physician. 4. Nerve Damage: This is more common when the treatment is an invasive one, but it can also happen with the use of medications, such as those used in the treatment of cancer. The damage can occur to small secondary nerves, or to large primary ones, such as those in the spinal cord and brain. This damage may be temporary or permanent and it may lead to impairments that can range from temporary numbness to permanent paralysis and/or brain death. 5. Allergic Reactions: Any time a substance or material comes in contact with our body, there is the possibility of an allergic reaction. These can range from a mild skin rash (contact dermatitis) to a severe systemic reaction (anaphylactic reaction), which can result in death. 6. Death: In general, any medical intervention can result in death, most of the time due to an unforeseen complication. ____________________________________________________________________________________________  Radiofrequency Lesioning Radiofrequency lesioning is a procedure that is performed to relieve pain. The procedure is often used for back, neck, or arm pain. Radiofrequency lesioning involves the use of a machine that creates radio waves to make heat. During the procedure, the heat is applied to the nerve that carries the pain signal. The heat damages the nerve and interferes with the pain signal. Pain relief usually starts about 2 weeks after the procedure and lasts for 6 months to 1 year. Tell a health care provider   about:  Any allergies you have.  All medicines you are taking, including vitamins, herbs, eye drops, creams,  and over-the-counter medicines.  Any problems you or family members have had with anesthetic medicines.  Any blood disorders you have.  Any surgeries you have had.  Any medical conditions you have.  Whether you are pregnant or may be pregnant. What are the risks? Generally, this is a safe procedure. However, problems may occur, including:  Pain or soreness at the injection site.  Infection at the injection site.  Damage to nerves or blood vessels.  What happens before the procedure?  Ask your health care provider about: ? Changing or stopping your regular medicines. This is especially important if you are taking diabetes medicines or blood thinners. ? Taking medicines such as aspirin and ibuprofen. These medicines can thin your blood. Do not take these medicines before your procedure if your health care provider instructs you not to.  Follow instructions from your health care provider about eating or drinking restrictions.  Plan to have someone take you home after the procedure.  If you go home right after the procedure, plan to have someone with you for 24 hours. What happens during the procedure?  You will be given one or more of the following: ? A medicine to help you relax (sedative). ? A medicine to numb the area (local anesthetic).  You will be awake during the procedure. You will need to be able to talk with the health care provider during the procedure.  With the help of a type of X-ray (fluoroscopy), the health care provider will insert a radiofrequency needle into the area to be treated.  Next, a wire that carries the radio waves (electrode) will be put through the radiofrequency needle. An electrical pulse will be sent through the electrode to verify the correct nerve. You will feel a tingling sensation, and you may have muscle twitching.  Then, the tissue that is around the needle tip will be heated by an electric current that is passed using the radiofrequency  machine. This will numb the nerves.  A bandage (dressing) will be put on the insertion area after the procedure is done. The procedure may vary among health care providers and hospitals. What happens after the procedure?  Your blood pressure, heart rate, breathing rate, and blood oxygen level will be monitored often until the medicines you were given have worn off.  Return to your normal activities as directed by your health care provider. This information is not intended to replace advice given to you by your health care provider. Make sure you discuss any questions you have with your health care provider. Document Released: 07/15/2011 Document Revised: 04/23/2016 Document Reviewed: 12/24/2014 Elsevier Interactive Patient Education  2018 [employer]   

## 2018-05-17 ENCOUNTER — Emergency Department: Payer: Medicare Other

## 2018-05-17 ENCOUNTER — Other Ambulatory Visit: Payer: Self-pay

## 2018-05-17 ENCOUNTER — Emergency Department
Admission: EM | Admit: 2018-05-17 | Discharge: 2018-05-17 | Disposition: A | Discharge status: Home / Self Care | Payer: Medicare Other | Attending: Emergency Medicine | Admitting: Emergency Medicine

## 2018-05-17 ENCOUNTER — Encounter: Payer: Self-pay | Admitting: Emergency Medicine

## 2018-05-17 DIAGNOSIS — I10 Essential (primary) hypertension: Secondary | ICD-10-CM | POA: Insufficient documentation

## 2018-05-17 DIAGNOSIS — Z79899 Other long term (current) drug therapy: Secondary | ICD-10-CM | POA: Diagnosis not present

## 2018-05-17 DIAGNOSIS — R404 Transient alteration of awareness: Secondary | ICD-10-CM | POA: Diagnosis not present

## 2018-05-17 DIAGNOSIS — I4891 Unspecified atrial fibrillation: Secondary | ICD-10-CM | POA: Diagnosis not present

## 2018-05-17 DIAGNOSIS — Z7982 Long term (current) use of aspirin: Secondary | ICD-10-CM | POA: Diagnosis not present

## 2018-05-17 DIAGNOSIS — R4182 Altered mental status, unspecified: Secondary | ICD-10-CM | POA: Diagnosis present

## 2018-05-17 LAB — COMPREHENSIVE METABOLIC PANEL
ALT: 8 U/L — ABNORMAL LOW (ref 14–54)
AST: 18 U/L (ref 15–41)
Albumin: 3.5 g/dL (ref 3.5–5.0)
Alkaline Phosphatase: 57 U/L (ref 38–126)
Anion gap: 7 (ref 5–15)
BUN: 19 mg/dL (ref 6–20)
CHLORIDE: 106 mmol/L (ref 101–111)
CO2: 27 mmol/L (ref 22–32)
CREATININE: 0.79 mg/dL (ref 0.44–1.00)
Calcium: 8.7 mg/dL — ABNORMAL LOW (ref 8.9–10.3)
Glucose, Bld: 85 mg/dL (ref 65–99)
POTASSIUM: 3.9 mmol/L (ref 3.5–5.1)
Sodium: 140 mmol/L (ref 135–145)
Total Bilirubin: 0.5 mg/dL (ref 0.3–1.2)
Total Protein: 6.2 g/dL — ABNORMAL LOW (ref 6.5–8.1)

## 2018-05-17 LAB — URINALYSIS, COMPLETE (UACMP) WITH MICROSCOPIC
BILIRUBIN URINE: NEGATIVE
Glucose, UA: NEGATIVE mg/dL
KETONES UR: NEGATIVE mg/dL
Nitrite: NEGATIVE
PROTEIN: NEGATIVE mg/dL
Specific Gravity, Urine: 1.006 (ref 1.005–1.030)
pH: 5 (ref 5.0–8.0)

## 2018-05-17 LAB — SALICYLATE LEVEL

## 2018-05-17 LAB — CBC
HCT: 36.3 % (ref 35.0–47.0)
Hemoglobin: 11.9 g/dL — ABNORMAL LOW (ref 12.0–16.0)
MCH: 28.5 pg (ref 26.0–34.0)
MCHC: 32.6 g/dL (ref 32.0–36.0)
MCV: 87.3 fL (ref 80.0–100.0)
Platelets: 285 10*3/uL (ref 150–440)
RBC: 4.16 MIL/uL (ref 3.80–5.20)
RDW: 15.3 % — AB (ref 11.5–14.5)
WBC: 5.1 10*3/uL (ref 3.6–11.0)

## 2018-05-17 LAB — TROPONIN I

## 2018-05-17 NOTE — ED Triage Notes (Signed)
[facility]. Pt states "I just don't feel like myself." Denies pain, no focal weakness noted. Disoriented to place, time, and situation.

## 2018-05-17 NOTE — ED Notes (Signed)
[facility] [phone]) notified of discharge and that pt's discharge summary and DNR form are with daughter.

## 2018-05-17 NOTE — ED Triage Notes (Deleted)
Pt is here with his son, pt c/o abd pain with constipation for the past couple of days, states he has a hx of impaction in the past. Pt denies N/V/D/fever..Marland Kitchen

## 2018-05-17 NOTE — ED Provider Notes (Signed)
[facility] Emergency Department Provider Note  ____________________________________________   First MD Initiated Contact with Patient 05/17/18 1103     (approximate)  I have reviewed the triage vital signs and the nursing notes.   HISTORY  Chief Complaint Altered Mental Status   HPI Amber Holland is a 84 y.o. female who comes to the emergency department via EMS from her nursing home for reportedly an episode of altered mental status this morning.  The patient says "I just do not feel like myself".  According to EMS the patient was at home when her daughter came to visit and noted the patient has somewhat slurred speech and was a little bit more difficult to arouse so she called 911.  The patient herself did not want to come to the hospital and she only wants to go home.  She is in no pain.  No recent medication changes.  EMS noted normal vital signs and a normal blood sugar in route.  The patient's symptoms came on gradually have been constant and nothing in particular seems to make them better or worse.  Past Medical History:  Diagnosis Date  . AF (atrial fibrillation) (HCC)   . Arthritis   . Constipation   . Cystocele   . GERD (gastroesophageal reflux disease)   . Hypertension   . Nocturia   . Osteoporosis   . Urethral caruncle   . Urinary incontinence   . Vaginal atrophy     Patient Active Problem List   Diagnosis Date Noted  . Osteoarthritis of knee (Left) 04/03/2018  . Chronic knee pain (Primary Area of Pain) (Left) 03/31/2018  . Chronic pain syndrome 03/31/2018  . Pharmacologic therapy 03/31/2018  . Disorder of skeletal system 03/31/2018  . Problems influencing health status 03/31/2018  . Vaginal pessary in situ 03/16/2016  . Urinary incontinence 08/27/2015  . Cystocele, midline 08/27/2015  . Bilateral carpal tunnel syndrome 01/30/2015  . B12 deficiency 12/25/2014  . Hereditary and idiopathic neuropathy 12/25/2014  . Peripheral  polyneuropathy 12/25/2014  . Atrial fibrillation (HCC) 05/09/2014  . Benign essential hypertension 05/09/2014    Past Surgical History:  Procedure Laterality Date  . CARPAL TUNNEL RELEASE    . NO PAST SURGERIES      Prior to Admission medications   Medication Sig Start Date End Date Taking? Authorizing Provider  acetaminophen (TYLENOL) 325 MG tablet Take 650 mg by mouth 3 (three) times daily as needed (Knee pain).    Yes [provider]  amLODipine (NORVASC) 2.5 MG tablet Take 2.5 mg by mouth once daily. 11/30/16  Yes [provider]  aspirin EC 81 MG tablet Take 81 mg by mouth daily.    Yes [provider]  candesartan (ATACAND) 32 MG tablet TAKE 1 TABLET DAILY 11/20/14  Yes [provider]  celecoxib (CELEBREX) 200 MG capsule Take 200 mg by mouth daily.  10/15/15  Yes [provider]  diclofenac sodium (VOLTAREN) 1 % GEL Apply 2 g topically 4 (four) times daily as needed (Knee pain).  03/23/17  Yes [provider]  docusate sodium (COLACE) 100 MG capsule Take 100 mg by mouth daily as needed (for constipation if Miralax does not work.).   Yes [provider]  hydrOXYzine (ATARAX/VISTARIL) 25 MG tablet Take 25 mg by mouth 3 (three) times daily as needed for itching.   Yes [provider]  metoprolol tartrate (LOPRESSOR) 25 MG tablet Take 25 mg by mouth 2 (two) times daily as needed.  Yes [provider]  omeprazole (PRILOSEC) 40 MG capsule TAKE 1 CAPSULE DAILY 02/18/15  Yes [provider]  polyethylene glycol (MIRALAX / GLYCOLAX) packet Take 17 g by mouth daily.   Yes [provider]  propafenone (RYTHMOL) 225 MG tablet TAKE 1 TABLET BY MOUTH TWICE A DAY 03/04/15  Yes [provider]  raloxifene (EVISTA) 60 MG tablet TAKE 1 TABLET DAILY 05/08/15  Yes [provider]  trospium (SANCTURA) 20 MG tablet TAKE 1 TABLET TWICE A DAY 03/28/18  Yes Hildred Laserherry, Anika, MD  vitamin B-12  (CYANOCOBALAMIN) 1000 MCG tablet Take 1,000 mcg by mouth daily.    Yes [provider]    Allergies Gabapentin  Family History  Problem Relation Age of Onset  . Cancer Neg Hx   . Diabetes Neg Hx   . Heart disease Neg Hx     Social History Social History   Tobacco Use  . Smoking status: Never Smoker  . Smokeless tobacco: Never Used  Substance Use Topics  . Alcohol use: Yes    Comment: occas  . Drug use: No    Review of Systems Constitutional: No fever/chills Eyes: No visual changes. ENT: No sore throat. Cardiovascular: Denies chest pain. Respiratory: Denies shortness of breath. Gastrointestinal: No abdominal pain.  No nausea, no vomiting.  No diarrhea.  No constipation. Genitourinary: Negative for dysuria. Musculoskeletal: Negative for back pain. Skin: Negative for rash. Neurological: Positive for slurred speech  ____________________________________________   PHYSICAL EXAM:  VITAL SIGNS: ED Triage Vitals  Enc Vitals Group     BP      Pulse      Resp      Temp      Temp src      SpO2      Weight      Height      Head Circumference      Peak Flow      Pain Score      Pain Loc      Pain Edu?      Excl. in GC?     Constitutional: Alert and oriented x4 kyphotic pleasant cooperative no distress Eyes: PERRL EOMI. midrange and brisk Head: Atraumatic. Nose: No congestion/rhinnorhea. Mouth/Throat: No trismus Neck: No stridor.   Cardiovascular: Normal rate, regular rhythm.  He had a 6 systolic murmur best heard at right sternal border Respiratory: Normal respiratory effort.  No retractions. Lungs CTAB and moving good air Gastrointestinal: Soft nontender Musculoskeletal: No lower extremity edema   Neurologic: Somewhat slurred speech.  No facial droop.  5 out of 5 grips biceps triceps hip flexion hip extension sensation intact light touch throughout Skin:  Skin is warm, dry and intact. No rash noted. Psychiatric: Pleasant and  cooperative    ____________________________________________   DIFFERENTIAL includes but not limited to  Stroke, TIA, dehydration, metabolic derangement, urinary tract infection ____________________________________________   LABS (all labs ordered are listed, but only abnormal results are displayed)  Labs Reviewed  URINALYSIS, COMPLETE (UACMP) WITH MICROSCOPIC - Abnormal; Notable for the following components:      Result Value   Color, Urine YELLOW (*)    APPearance HAZY (*)    Hgb urine dipstick SMALL (*)    Leukocytes, UA LARGE (*)    Bacteria, UA FEW (*)    All other components within normal limits  CBC - Abnormal; Notable for the following components:   Hemoglobin 11.9 (*)    RDW 15.3 (*)    All other components within normal  limits  COMPREHENSIVE METABOLIC PANEL - Abnormal; Notable for the following components:   Calcium 8.7 (*)    Total Protein 6.2 (*)    ALT 8 (*)    All other components within normal limits  TROPONIN I  SALICYLATE LEVEL    Lab work reviewed by me with no signs of acute ischemia no signs of infection __________________________________________  EKG  ED ECG REPORT I, Merrily Brittle, the attending physician, personally viewed and interpreted this ECG.  Date: 05/17/2018 EKG Time:  Rate: 59 Rhythm: Sinus bradycardia QRS Axis: normal Intervals: normal ST/T Wave abnormalities: normal Narrative Interpretation: no evidence of acute ischemia  ____________________________________________  RADIOLOGY  Head CT reviewed by me with no acute disease Chest x-ray reviewed by me with no acute disease ____________________________________________   PROCEDURES  Procedure(s) performed: no  Procedures  Critical Care performed: no  Observation: no ____________________________________________   INITIAL IMPRESSION / ASSESSMENT AND PLAN / ED COURSE  Pertinent labs & imaging results that were available during my care of the patient were reviewed by  me and considered in my medical decision making (see chart for details).  The patient arrives well-appearing with a normal blood sugar and hemodynamically stable.  She does have slightly garbled speech although is neurologically intact otherwise.  She does take aspirin daily some concern for salicylates on.     ----------------------------------------- 1:00 PM on 05/17/2018 -----------------------------------------  The patient is back to baseline according to her daughter and son-in-law at bedside.  I had a lengthy discussion regarding the diagnostic uncertainty and the possibility of a stroke versus TIA but the patient is already taking aspirin and is not a good candidate for anticoagulation given her age and multiple falls.  At this point she is medically stable for outpatient management family verbalizes understanding agreement with the plan. ____________________________________________   FINAL CLINICAL IMPRESSION(S) / ED DIAGNOSES  Final diagnoses:  Transient alteration of awareness      NEW MEDICATIONS STARTED DURING THIS VISIT:  Discharge Medication List as of 05/17/2018 12:59 PM       Note:  This document was prepared using Dragon voice recognition software and may include unintentional dictation errors.     Merrily Brittle, MD 05/19/18 [phone]

## 2018-05-17 NOTE — ED Notes (Signed)

## 2018-05-17 NOTE — Discharge Instructions (Signed)
Fortunately today your head CT, your blood work, and your urinalysis were very reassuring.  Please continue taking your aspirin every day as prescribed and follow-up with your primary care physician this coming Monday for reevaluation.  Return to the emergency department sooner for any concerns whatsoever.  It was a pleasure to take care of you today, and thank you for coming to our emergency department.  If you have any questions or concerns before leaving please ask the nurse to grab me and I'm more than happy to go through your aftercare instructions again.  If you were prescribed any opioid pain medication today such as Norco, Vicodin, Percocet, morphine, hydrocodone, or oxycodone please make sure you do not drive when you are taking this medication as it can alter your ability to drive safely.  If you have any concerns once you are home that you are not improving or are in fact getting worse before you can make it to your follow-up appointment, please do not hesitate to call 911 and come back for further evaluation.  Merrily BrittleNeil Arlena Marsan, MD  Results for orders placed or performed during the hospital encounter of 05/17/18  Urinalysis, Complete w Microscopic  Result Value Ref Range   Color, Urine YELLOW (A) YELLOW   APPearance HAZY (A) CLEAR   Specific Gravity, Urine 1.006 1.005 - 1.030   pH 5.0 5.0 - 8.0   Glucose, UA NEGATIVE NEGATIVE mg/dL   Hgb urine dipstick SMALL (A) NEGATIVE   Bilirubin Urine NEGATIVE NEGATIVE   Ketones, ur NEGATIVE NEGATIVE mg/dL   Protein, ur NEGATIVE NEGATIVE mg/dL   Nitrite NEGATIVE NEGATIVE   Leukocytes, UA LARGE (A) NEGATIVE   RBC / HPF 0-5 0 - 5 RBC/hpf   WBC, UA 0-5 0 - 5 WBC/hpf   Bacteria, UA FEW (A) NONE SEEN   Squamous Epithelial / LPF 0-5 0 - 5   Hyaline Casts, UA PRESENT   CBC  Result Value Ref Range   WBC 5.1 3.6 - 11.0 K/uL   RBC 4.16 3.80 - 5.20 MIL/uL   Hemoglobin 11.9 (L) 12.0 - 16.0 g/dL   HCT 16.136.3 09.635.0 - 04.547.0 %   MCV 87.3 80.0 - 100.0 fL     MCH 28.5 26.0 - 34.0 pg   MCHC 32.6 32.0 - 36.0 g/dL   RDW 40.915.3 (H) 81.111.5 - 91.414.5 %   Platelets 285 150 - 440 K/uL  Comprehensive metabolic panel  Result Value Ref Range   Sodium 140 135 - 145 mmol/L   Potassium 3.9 3.5 - 5.1 mmol/L   Chloride 106 101 - 111 mmol/L   CO2 27 22 - 32 mmol/L   Glucose, Bld 85 65 - 99 mg/dL   BUN 19 6 - 20 mg/dL   Creatinine, Ser 7.820.79 0.44 - 1.00 mg/dL   Calcium 8.7 (L) 8.9 - 10.3 mg/dL   Total Protein 6.2 (L) 6.5 - 8.1 g/dL   Albumin 3.5 3.5 - 5.0 g/dL   AST 18 15 - 41 U/L   ALT 8 (L) 14 - 54 U/L   Alkaline Phosphatase 57 38 - 126 U/L   Total Bilirubin 0.5 0.3 - 1.2 mg/dL   GFR calc non Af Amer >60 >60 mL/min   GFR calc Af Amer >60 >60 mL/min   Anion gap 7 5 - 15  Troponin I  Result Value Ref Range   Troponin I <0.03 <0.03 ng/mL  Salicylate level  Result Value Ref Range   Salicylate Lvl <4.0 2.8 - 30.0 mg/dL  Ct Head Wo Contrast  Result Date: 05/17/2018 CLINICAL DATA:  Disorientation.  Altered mental status EXAM: CT HEAD WITHOUT CONTRAST TECHNIQUE: Contiguous axial images were obtained from the base of the skull through the vertex without intravenous contrast. COMPARISON:  Apr 18, 2014 FINDINGS: Brain: Mild diffuse atrophy is stable. There is no evident intracranial mass, hemorrhage, extra-axial fluid collection, or midline shift. Small fatty deposits are noted along the falx, a benign and stable finding. There is patchy small vessel disease in the centra semiovale bilaterally. There is evidence of a prior small infarct in the posterosuperior left temporal lobe, stable. No acute infarct is evident. Vascular: No appreciable hyperdense vessels evident. There is calcification in each carotid siphon region. Skull: Bony calvarium appears intact. Sinuses/Orbits: There is mucosal thickening in several ethmoid air cells with mild opacification in several ethmoid air cells. Other paranasal sinuses are clear. Orbits appear symmetric bilaterally. Other: Mastoid  air cells are clear. IMPRESSION: Atrophy with periventricular small vessel disease. 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 No acute infarct. No mass or hemorrhage. There are foci of arterial vascular calcification. There is ethmoid sinus disease bilaterally. Electronically Signed   By: Bretta Bang III M.D.   On: 05/17/2018 11:47   Dg Chest Port 1 View  Result Date: 05/17/2018 CLINICAL DATA:  Cough EXAM: PORTABLE CHEST 1 VIEW COMPARISON:  10/06/2017 FINDINGS: Cardiac shadow is enlarged but stable. Hiatal hernia is again seen and stable. Aortic calcifications are noted. The lungs are well aerated bilaterally. No focal infiltrate or sizable effusion is seen. No bony abnormality is noted. IMPRESSION: Stable appearance of the chest. Electronically Signed   By: Alcide Clever M.D.   On: 05/17/2018 12:04

## 2018-07-05 ENCOUNTER — Encounter: Payer: Self-pay | Admitting: Pain Medicine

## 2018-07-05 ENCOUNTER — Ambulatory Visit
Admission: RE | Admit: 2018-07-05 | Discharge: 2018-07-05 | Disposition: A | Discharge status: Home / Self Care | Payer: Medicare Other | Source: Ambulatory Visit | Attending: Pain Medicine | Admitting: Pain Medicine

## 2018-07-05 ENCOUNTER — Ambulatory Visit (HOSPITAL_BASED_OUTPATIENT_CLINIC_OR_DEPARTMENT_OTHER): Payer: Medicare Other | Admitting: Pain Medicine

## 2018-07-05 ENCOUNTER — Other Ambulatory Visit: Payer: Self-pay

## 2018-07-05 VITALS — BP 187/89 | HR 65 | Temp 97.8°F | Resp 14 | Ht <= 58 in | Wt 110.0 lb

## 2018-07-05 DIAGNOSIS — M25562 Pain in left knee: Secondary | ICD-10-CM | POA: Diagnosis present

## 2018-07-05 DIAGNOSIS — M1712 Unilateral primary osteoarthritis, left knee: Secondary | ICD-10-CM | POA: Insufficient documentation

## 2018-07-05 DIAGNOSIS — G8929 Other chronic pain: Secondary | ICD-10-CM | POA: Insufficient documentation

## 2018-07-05 DIAGNOSIS — E538 Deficiency of other specified B group vitamins: Secondary | ICD-10-CM

## 2018-07-05 MED ORDER — MIDAZOLAM HCL 5 MG/5ML IJ SOLN
1.0000 mg | INTRAMUSCULAR | Status: DC | PRN
  Administered 2018-07-05: 1 mg via INTRAVENOUS
  Filled 2018-07-05: qty 5

## 2018-07-05 MED ORDER — METHYLPREDNISOLONE ACETATE 80 MG/ML IJ SUSP
80.0000 mg | Freq: Once | INTRAMUSCULAR | Status: AC
  Administered 2018-07-05: 80 mg
  Filled 2018-07-05: qty 1

## 2018-07-05 MED ORDER — LACTATED RINGERS IV SOLN
1000.0000 mL | Freq: Once | INTRAVENOUS | Status: AC
  Administered 2018-07-05: 1000 mL via INTRAVENOUS

## 2018-07-05 MED ORDER — FENTANYL CITRATE (PF) 100 MCG/2ML IJ SOLN
25.0000 ug | INTRAMUSCULAR | Status: DC | PRN
  Filled 2018-07-05: qty 2

## 2018-07-05 MED ORDER — LIDOCAINE HCL 2 % IJ SOLN
20.0000 mL | Freq: Once | INTRAMUSCULAR | Status: AC
  Administered 2018-07-05: 400 mg
  Filled 2018-07-05: qty 20

## 2018-07-05 MED ORDER — ROPIVACAINE HCL 2 MG/ML IJ SOLN
9.0000 mL | Freq: Once | INTRAMUSCULAR | Status: AC
  Administered 2018-07-05: 10 mL
  Filled 2018-07-05: qty 10

## 2018-07-05 NOTE — Progress Notes (Signed)
Safety precautions to be maintained throughout the outpatient stay will include: orient to surroundings, keep bed in low position, maintain call bell within reach at all times, provide assistance with transfer out of bed and ambulation.  

## 2018-07-05 NOTE — Progress Notes (Signed)
Patient's Name: Amber Holland  MRN: [id number]  Referring Provider: Delano Metz, MD  DOB: 01-Mar-1920  PCP: Lynnea Ferrier, MD  DOS: 07/05/2018  Note by: Oswaldo Done, MD  Service setting: Ambulatory outpatient  Specialty: Interventional Pain Management  Patient type: Established  Location: [facility] (AMB) Pain Management Facility  Visit type: Interventional Procedure   Primary Reason for Visit: Interventional Pain Management Treatment. CC: Knee Pain (left)  Procedure:          Anesthesia, Analgesia, Anxiolysis:  Type: Therapeutic Superior-lateral, Superior-medial, and Inferior-medial, Genicular Nerve Radiofrequency Ablation. Region: Lateral, Anterior, and Medial aspects of the knee joint, above and below the knee joint proper. Level: Superior and inferior to the knee joint. Laterality: Left  Type: Moderate (Conscious) Sedation combined with Local Anesthesia Indication(s): Analgesia and Anxiety Route: Intravenous (IV) IV Access: Secured Sedation: Meaningful verbal contact was maintained at all times during the procedure  Local Anesthetic: Lidocaine 1-2%   Indications: 1. Osteoarthritis of knee (Left)   2. Chronic knee pain (Primary Area of Pain) (Left)   3. B12 deficiency    Amber Holland has been dealing with the above chronic pain for longer than three months and has either failed to respond, was unable to tolerate, or simply did not get enough benefit from other more conservative therapies including, but not limited to: 1. Over-the-counter medications 2. Anti-inflammatory medications 3. Muscle relaxants 4. Membrane stabilizers 5. Opioids 6. Physical therapy 7. Modalities (Heat, ice, etc.) 8. Invasive techniques such as nerve blocks. Amber Holland has attained more than 50% relief of the pain from a series of diagnostic injections conducted in separate occasions.  Pain Score: Pre-procedure: 0-No pain/10 Post-procedure: 0-No pain/10  Pre-op Assessment:  Ms.  Holland is a 84 y.o. (year old), female patient, seen today for interventional treatment. She  has a past surgical history that includes No past surgeries and Carpal tunnel release. Amber Holland has a current medication list which includes the following prescription(s): acetaminophen, amlodipine, aspirin ec, candesartan, celecoxib, diclofenac sodium, docusate sodium, hydroxyzine, metoprolol tartrate, omeprazole, polyethylene glycol, propafenone, raloxifene, trospium, and vitamin b-12, and the following Facility-Administered Medications: fentanyl and midazolam. Her primarily concern today is the Knee Pain (left)  Initial Vital Signs:  Pulse/HCG Rate: 65ECG Heart Rate: 60 Temp: 97.9 F (36.6 C) Resp: 18 BP: (!) 185/82 SpO2: 97 %  BMI: Estimated body mass index is 22.99 kg/m as calculated from the following:   Height as of this encounter: 4\' 10"  (1.473 m).   Weight as of this encounter: 110 lb (49.9 kg).  Risk Assessment: Allergies: Reviewed. She is allergic to gabapentin.  Allergy Precautions: None required Coagulopathies: Reviewed. None identified.  Blood-thinner therapy: None at this time Active Infection(s): Reviewed. None identified. Amber Holland is afebrile  Site Confirmation: Amber Holland was asked to confirm the procedure and laterality before marking the site Procedure checklist: Completed Consent: Before the procedure and under the influence of no sedative(s), amnesic(s), or anxiolytics, the patient was informed of the treatment options, risks and possible complications. To fulfill our ethical and legal obligations, as recommended by the American Medical Association's Code of Ethics, I have informed the patient of my clinical impression; the nature and purpose of the treatment or procedure; the risks, benefits, and possible complications of the intervention; the alternatives, including doing nothing; the risk(s) and benefit(s) of the alternative treatment(s) or procedure(s); and  the risk(s) and benefit(s) of doing nothing. The patient was provided information about the general risks and possible complications associated with  the procedure. These may include, but are not limited to: failure to achieve desired goals, infection, bleeding, organ or nerve damage, allergic reactions, paralysis, and death. In addition, the patient was informed of those risks and complications associated to the procedure, such as failure to decrease pain; infection; bleeding; organ or nerve damage with subsequent damage to sensory, motor, and/or autonomic systems, resulting in permanent pain, numbness, and/or weakness of one or several areas of the body; allergic reactions; (i.e.: anaphylactic reaction); and/or death. Furthermore, the patient was informed of those risks and complications associated with the medications. These include, but are not limited to: allergic reactions (i.e.: anaphylactic or anaphylactoid reaction(s)); adrenal axis suppression; blood sugar elevation that in diabetics may result in ketoacidosis or comma; water retention that in patients with history of congestive heart failure may result in shortness of breath, pulmonary edema, and decompensation with resultant heart failure; weight gain; swelling or edema; medication-induced neural toxicity; particulate matter embolism and blood vessel occlusion with resultant organ, and/or nervous system infarction; and/or aseptic necrosis of one or more joints. Finally, the patient was informed that Medicine is not an exact science; therefore, there is also the possibility of unforeseen or unpredictable risks and/or possible complications that may result in a catastrophic outcome. The patient indicated having understood very clearly. We have given the patient no guarantees and we have made no promises. Enough time was given to the patient to ask questions, all of which were answered to the patient's satisfaction. Amber Holland has indicated that she  wanted to continue with the procedure. Attestation: I, the ordering provider, attest that I have discussed with the patient the benefits, risks, side-effects, alternatives, likelihood of achieving goals, and potential problems during recovery for the procedure that I have provided informed consent. Date  Time: 07/05/2018 12:57 PM  Pre-Procedure Preparation:  Monitoring: As per clinic protocol. Respiration, ETCO2, SpO2, BP, heart rate and rhythm monitor placed and checked for adequate function Safety Precautions: Patient was assessed for positional comfort and pressure points before starting the procedure. Time-out: I initiated and conducted the "Time-out" before starting the procedure, as per protocol. The patient was asked to participate by confirming the accuracy of the "Time Out" information. Verification of the correct person, site, and procedure were performed and confirmed by me, the nursing staff, and the patient. "Time-out" conducted as per Joint Commission's Universal Protocol (UP.01.01.01). Time: 1408  Description of Procedure:          Position: Supine Target Area: For Genicular Nerve block(s), the targets are: the superior-lateral genicular nerve, located in the lateral distal portion of the femoral shaft as it curves to form the lateral epicondyle, in the region of the distal femoral metaphysis; the superior-medial genicular nerve, located in the medial distal portion of the femoral shaft as it curves to form the medial epicondyle; and the inferior-medial genicular nerve, located in the medial, proximal portion of the tibial shaft, as it curves to form the medial epicondyle, in the region of the proximal tibial metaphysis. Approach: Anterior, ipsilateral approach. Area Prepped: Entire knee area, from mid-thigh to mid-shin, lateral, anterior, and medial aspects. Prepping solution: Hibiclens (4.0% Chlorhexidine gluconate solution) Safety Precautions: Aspiration looking for blood return was  conducted prior to all injections. At no point did we inject any substances, as a needle was being advanced. No attempts were made at seeking any paresthesias. Safe injection practices and needle disposal techniques used. Medications properly checked for expiration dates. SDV (single dose vial) medications used. Description of the Procedure:  Protocol guidelines were followed. The patient was placed in position over the procedure table. The target area was identified and the area prepped in the usual manner. The skin and muscle were infiltrated with local anesthetic. Appropriate amount of time allowed to pass for local anesthetics to take effect. Radiofrequency needles were introduced to the target area using fluoroscopic guidance. Using the NeuroTherm NT1100 Radiofrequency Generator, sensory stimulation using 50 Hz was used to locate & identify the nerve, making sure that the needle was positioned such that there was no sensory stimulation below 0.3 V or above 0.7 V. Stimulation using 2 Hz was used to evaluate the motor component. Care was taken not to lesion any nerves that demonstrated motor stimulation of the lower extremities at an output of less than 2.5 times that of the sensory threshold, or a maximum of 2.0 V. Once satisfactory placement of the needles was achieved, the numbing solution was slowly injected after negative aspiration. After waiting for at least 2 minutes, the ablation was performed at 80 degrees C for 60 seconds, using regular Radiofrequency settings. Once the procedure was completed, the needles were then removed and the area cleansed, making sure to leave some of the prepping solution back to take advantage of its long term bactericidal properties. Intra-operative Compliance: Compliant Vitals:   07/05/18 1436 07/05/18 1440 07/05/18 1450 07/05/18 1507  BP: (!) 180/83 (!) 181/85 (!) 170/89 (!) 187/89  Pulse:      Resp: 16 17 (!) 29 14  Temp:  97.9 F (36.6 C)  97.8 F (36.6 C)   TempSrc:  Temporal  Temporal  SpO2: 96% 97% 96% 96%  Weight:      Height:        Start Time: 1408 hrs. End Time: 1438 hrs. Materials & Medications:  Needle(s) Type: Teflon-coated, curved tip, Radiofrequency needle(s) Gauge: 22G Length: 10cm Medication(s): Please see orders for medications and dosing details.  Imaging Guidance (Non-Spinal):          Type of Imaging Technique: Fluoroscopy Guidance (Non-Spinal) Indication(s): Assistance in needle guidance and placement for procedures requiring needle placement in or near specific anatomical locations not easily accessible without such assistance. Exposure Time: Please see nurses notes. Contrast: Before injecting any contrast, we confirmed that the patient did not have an allergy to iodine, shellfish, or radiological contrast. Once satisfactory needle placement was completed at the desired level, radiological contrast was injected. Contrast injected under live fluoroscopy. No contrast complications. See chart for type and volume of contrast used. Fluoroscopic Guidance: I was personally present during the use of fluoroscopy. "Tunnel Vision Technique" used to obtain the best possible view of the target area. Parallax error corrected before commencing the procedure. "Direction-depth-direction" technique used to introduce the needle under continuous pulsed fluoroscopy. Once target was reached, antero-posterior, oblique, and lateral fluoroscopic projection used confirm needle placement in all planes. Images permanently stored in EMR. Interpretation: I personally interpreted the imaging intraoperatively. Adequate needle placement confirmed in multiple planes. Appropriate spread of contrast into desired area was observed. No evidence of afferent or efferent intravascular uptake. Permanent images saved into the patient's record.  Antibiotic Prophylaxis:   Anti-infectives (From admission, onward)   None     Indication(s): None  identified  Post-operative Assessment:  Post-procedure Vital Signs:  Pulse/HCG Rate: 6564 Temp: 97.8 F (36.6 C) Resp: 14 BP: (!) 187/89 SpO2: 96 %  EBL: None  Complications: No immediate post-treatment complications observed by team, or reported by patient.  Note: The patient tolerated the entire procedure  well. A repeat set of vitals were taken after the procedure and the patient was kept under observation following institutional policy, for this type of procedure. Post-procedural neurological assessment was performed, showing return to baseline, prior to discharge. The patient was provided with post-procedure discharge instructions, including a section on how to identify potential problems. Should any problems arise concerning this procedure, the patient was given instructions to immediately contact us, at any time, without hesitation. In any case, we plan to contact the patient by telephone for a follow-up status report regarding this interventional procedure.  Comments:  No additional relevant information.  Plan of Care    Imaging Orders     DG C-Arm 1-60 Min-No Report  Procedure Orders     Radiofrequency,Genicular  Medications ordered for procedure: Meds ordered this encounter  Medications  . lidocaine (XYLOCAINE) 2 % (with pres) injection 400 mg  . midazolam (VERSED) 5 MG/5ML injection 1-2 mg    Make sure Flumazenil is available in the pyxis when using this medication. If oversedation occurs, administer 0.2 mg IV over 15 sec. If after 45 sec no response, administer 0.2 mg again over 1 min; may repeat at 1 min intervals; not to exceed 4 doses (1 mg)  . fentaNYL (SUBLIMAZE) injection 25-50 mcg    Make sure Narcan is available in the pyxis when using this medication. In the event of respiratory depression (RR< 8/min): Titrate NARCAN (naloxone) in increments of 0.1 to 0.2 mg IV at 2-3 minute intervals, until desired degree of reversal.  . lactated ringers infusion 1,000 mL  .  ropivacaine (PF) 2 mg/mL (0.2%) (NAROPIN) injection 9 mL  . methylPREDNISolone acetate (DEPO-MEDROL) injection 80 mg   Medications administered: We administered lidocaine, midazolam, lactated ringers, ropivacaine (PF) 2 mg/mL (0.2%), and methylPREDNISolone acetate.  See the medical record for exact dosing, route, and time of administration.  New Prescriptions   No medications on file   Disposition: Discharge home  Discharge Date & Time: 07/05/2018; 1508 hrs.   Physician-requested Follow-up: Return for Post-RFA eval (6 wks), w/ Thad Ranger, NP.  Future Appointments  Date Time Provider Department Center  08/09/2018  9:45 AM Hildred Laser, MD [facility] None  08/16/2018 12:45 PM Barbette Merino, NP [facility] None   Primary Care Physician: Lynnea Ferrier, MD Location: [facility] Note by: Oswaldo Done, MD Date: 07/05/2018; Time: 3:22 PM  Disclaimer:  Medicine is not an Visual merchandiser. The only guarantee in medicine is that nothing is guaranteed. It is important to note that the decision to proceed with this intervention was based on the information collected from the patient. The Data and conclusions were drawn from the patient's questionnaire, the interview, and the physical examination. Because the information was provided in large part by the patient, it cannot be guaranteed that it has not been purposely or unconsciously manipulated. Every effort has been made to obtain as much relevant data as possible for this evaluation. It is important to note that the conclusions that lead to this procedure are derived in large part from the available data. Always take into account that the treatment will also be dependent on availability of resources and existing treatment guidelines, considered by other Pain Management Practitioners as being common knowledge and practice, at the time of the intervention. For Medico-Legal purposes, it is also important to point out that  variation in procedural techniques and pharmacological choices are the acceptable norm. The indications, contraindications, technique, and results of the above procedure  should only be interpreted and judged by a Board-Certified Interventional Pain Specialist with extensive familiarity and expertise in the same exact procedure and technique.

## 2018-07-05 NOTE — Patient Instructions (Signed)

## 2018-07-06 ENCOUNTER — Telehealth: Payer: Self-pay

## 2018-07-06 NOTE — Telephone Encounter (Signed)
Post procedure phone call.   

## 2018-07-14 ENCOUNTER — Ambulatory Visit: Payer: Medicare Other | Admitting: Pain Medicine

## 2018-08-09 ENCOUNTER — Ambulatory Visit (INDEPENDENT_AMBULATORY_CARE_PROVIDER_SITE_OTHER): Payer: Medicare Other | Admitting: Obstetrics and Gynecology

## 2018-08-09 ENCOUNTER — Encounter: Payer: Self-pay | Admitting: Obstetrics and Gynecology

## 2018-08-09 VITALS — BP 146/57 | HR 61 | Ht <= 58 in | Wt 110.0 lb

## 2018-08-09 DIAGNOSIS — N814 Uterovaginal prolapse, unspecified: Secondary | ICD-10-CM

## 2018-08-09 DIAGNOSIS — Z4689 Encounter for fitting and adjustment of other specified devices: Secondary | ICD-10-CM

## 2018-08-09 DIAGNOSIS — R351 Nocturia: Secondary | ICD-10-CM | POA: Diagnosis not present

## 2018-08-09 NOTE — Progress Notes (Signed)
Pt stated that she is doing well no complaints.  

## 2018-08-09 NOTE — Progress Notes (Signed)
    GYNECOLOGY PROGRESS NOTE  Subjective:    Patient ID: Amber Holland, female    DOB: 01-09-1920, 84 y.o.   MRN: [id number]   Patient is a 84 y.o. female who presents for pessary check.Currently has a cystocele, and nocturia.  She reports no vaginal bleeding or discharge. She denies pelvic discomfort and difficulty urinating.  Is taking the Myrbetric as prescribed.   Of note, patient's daughter states that the patient has recently suffered a TIA in March. Is now living in an assisted living facility.    The following portions of the patient's history were reviewed and updated as appropriate: allergies, current medications, past family history, past medical history, past social history, past surgical history and problem list.  Review of Systems Pertinent items noted in HPI and remainder of comprehensive ROS otherwise negative.   Objective:  Blood pressure (!) 146/57, pulse 61, height 4\' 10"  (1.473 m), weight 110 lb (49.9 kg). General appearance: alert and no distress Pelvic:  ?The patient's Size 2 ring with support pessary was removed, cleaned and replaced without complications. Speculum examination revealed normal vaginal mucosa with no lesions or lacerations.   Assessment:   Pessary maintenance Nocturia with continuous leakage Cystocele  Plan:   - Patient currently on Myrbetric. - Patient to return in 4 months for next pessary check.  - Continue with Trimo-San gel as recommmended    Hildred Laser, MD [facility]

## 2018-08-16 ENCOUNTER — Ambulatory Visit: Payer: Medicare Other | Admitting: Nurse Practitioner

## 2018-12-04 ENCOUNTER — Emergency Department: Payer: Medicare Other

## 2018-12-04 ENCOUNTER — Other Ambulatory Visit: Payer: Self-pay

## 2018-12-04 ENCOUNTER — Inpatient Hospital Stay
Admission: EM | Admit: 2018-12-04 | Discharge: 2018-12-07 | DRG: 481 | Disposition: A | Discharge status: Skilled Nursing Facility | Payer: Medicare Other | Attending: Internal Medicine | Admitting: Internal Medicine

## 2018-12-04 DIAGNOSIS — W06XXXA Fall from bed, initial encounter: Secondary | ICD-10-CM | POA: Diagnosis present

## 2018-12-04 DIAGNOSIS — K219 Gastro-esophageal reflux disease without esophagitis: Secondary | ICD-10-CM | POA: Diagnosis present

## 2018-12-04 DIAGNOSIS — Z8673 Personal history of transient ischemic attack (TIA), and cerebral infarction without residual deficits: Secondary | ICD-10-CM

## 2018-12-04 DIAGNOSIS — E876 Hypokalemia: Secondary | ICD-10-CM | POA: Diagnosis present

## 2018-12-04 DIAGNOSIS — Y92092 Bedroom in other non-institutional residence as the place of occurrence of the external cause: Secondary | ICD-10-CM

## 2018-12-04 DIAGNOSIS — M1990 Unspecified osteoarthritis, unspecified site: Secondary | ICD-10-CM | POA: Diagnosis present

## 2018-12-04 DIAGNOSIS — I482 Chronic atrial fibrillation, unspecified: Secondary | ICD-10-CM | POA: Diagnosis present

## 2018-12-04 DIAGNOSIS — G309 Alzheimer's disease, unspecified: Secondary | ICD-10-CM | POA: Diagnosis present

## 2018-12-04 DIAGNOSIS — Z66 Do not resuscitate: Secondary | ICD-10-CM | POA: Diagnosis present

## 2018-12-04 DIAGNOSIS — Z888 Allergy status to other drugs, medicaments and biological substances status: Secondary | ICD-10-CM | POA: Diagnosis not present

## 2018-12-04 DIAGNOSIS — Z79899 Other long term (current) drug therapy: Secondary | ICD-10-CM

## 2018-12-04 DIAGNOSIS — F0280 Dementia in other diseases classified elsewhere without behavioral disturbance: Secondary | ICD-10-CM | POA: Diagnosis present

## 2018-12-04 DIAGNOSIS — Z7982 Long term (current) use of aspirin: Secondary | ICD-10-CM

## 2018-12-04 DIAGNOSIS — S72012A Unspecified intracapsular fracture of left femur, initial encounter for closed fracture: Secondary | ICD-10-CM | POA: Diagnosis present

## 2018-12-04 DIAGNOSIS — M25552 Pain in left hip: Secondary | ICD-10-CM | POA: Diagnosis present

## 2018-12-04 DIAGNOSIS — I10 Essential (primary) hypertension: Secondary | ICD-10-CM | POA: Diagnosis present

## 2018-12-04 DIAGNOSIS — M810 Age-related osteoporosis without current pathological fracture: Secondary | ICD-10-CM | POA: Diagnosis present

## 2018-12-04 DIAGNOSIS — Z419 Encounter for procedure for purposes other than remedying health state, unspecified: Secondary | ICD-10-CM

## 2018-12-04 DIAGNOSIS — S72002A Fracture of unspecified part of neck of left femur, initial encounter for closed fracture: Secondary | ICD-10-CM | POA: Diagnosis present

## 2018-12-04 LAB — CBC WITH DIFFERENTIAL/PLATELET
ABS IMMATURE GRANULOCYTES: 0.04 10*3/uL (ref 0.00–0.07)
Basophils Absolute: 0 10*3/uL (ref 0.0–0.1)
Basophils Relative: 0 %
Eosinophils Absolute: 0.1 10*3/uL (ref 0.0–0.5)
Eosinophils Relative: 1 %
HEMATOCRIT: 37.1 % (ref 36.0–46.0)
HEMOGLOBIN: 11.8 g/dL — AB (ref 12.0–15.0)
IMMATURE GRANULOCYTES: 0 %
LYMPHS ABS: 1.2 10*3/uL (ref 0.7–4.0)
LYMPHS PCT: 12 %
MCH: 27.9 pg (ref 26.0–34.0)
MCHC: 31.8 g/dL (ref 30.0–36.0)
MCV: 87.7 fL (ref 80.0–100.0)
MONO ABS: 1 10*3/uL (ref 0.1–1.0)
MONOS PCT: 9 %
NEUTROS ABS: 7.8 10*3/uL — AB (ref 1.7–7.7)
NEUTROS PCT: 78 %
Platelets: 300 10*3/uL (ref 150–400)
RBC: 4.23 MIL/uL (ref 3.87–5.11)
RDW: 15.7 % — ABNORMAL HIGH (ref 11.5–15.5)
WBC: 10.2 10*3/uL (ref 4.0–10.5)
nRBC: 0 % (ref 0.0–0.2)

## 2018-12-04 LAB — BASIC METABOLIC PANEL
Anion gap: 8 (ref 5–15)
BUN: 16 mg/dL (ref 8–23)
CHLORIDE: 101 mmol/L (ref 98–111)
CO2: 28 mmol/L (ref 22–32)
CREATININE: 0.64 mg/dL (ref 0.44–1.00)
Calcium: 8.8 mg/dL — ABNORMAL LOW (ref 8.9–10.3)
GFR calc Af Amer: >60 mL/min (ref >60)
GFR calc non Af Amer: >60 mL/min (ref >60)
GLUCOSE: 147 mg/dL — AB (ref 70–99)
POTASSIUM: 3.1 mmol/L — AB (ref 3.5–5.1)
SODIUM: 137 mmol/L (ref 135–145)

## 2018-12-04 LAB — PROTIME-INR
INR: 1.02
Prothrombin Time: 13.3 seconds (ref 11.4–15.2)

## 2018-12-04 LAB — SAMPLE TO BLOOD BANK

## 2018-12-04 MED ORDER — ACETAMINOPHEN 325 MG PO TABS
650.0000 mg | ORAL_TABLET | Freq: Four times a day (QID) | ORAL | Status: DC | PRN
  Administered 2018-12-04 – 2018-12-05 (×2): 650 mg via ORAL
  Filled 2018-12-04 (×2): qty 2

## 2018-12-04 MED ORDER — BUSPIRONE HCL 10 MG PO TABS
15.0000 mg | ORAL_TABLET | Freq: Two times a day (BID) | ORAL | Status: DC
  Administered 2018-12-05 – 2018-12-07 (×3): 15 mg via ORAL
  Filled 2018-12-04 (×5): qty 2

## 2018-12-04 MED ORDER — ENOXAPARIN SODIUM 40 MG/0.4ML ~~LOC~~ SOLN: 40.0000 mg | SUBCUTANEOUS | Status: DC

## 2018-12-04 MED ORDER — TRAZODONE HCL 50 MG PO TABS
25.0000 mg | ORAL_TABLET | Freq: Every evening | ORAL | Status: DC | PRN
  Filled 2018-12-04: qty 1

## 2018-12-04 MED ORDER — SERTRALINE HCL 50 MG PO TABS
50.0000 mg | ORAL_TABLET | Freq: Every evening | ORAL | Status: DC
  Administered 2018-12-04 – 2018-12-05 (×2): 50 mg via ORAL
  Filled 2018-12-04 (×3): qty 1

## 2018-12-04 MED ORDER — DARIFENACIN HYDROBROMIDE ER 7.5 MG PO TB24
7.5000 mg | ORAL_TABLET | Freq: Every day | ORAL | Status: DC
  Administered 2018-12-06 – 2018-12-07 (×2): 7.5 mg via ORAL
  Filled 2018-12-04 (×4): qty 1

## 2018-12-04 MED ORDER — ONDANSETRON HCL 4 MG/2ML IJ SOLN: 4.0000 mg | Freq: Four times a day (QID) | INTRAMUSCULAR | Status: DC | PRN

## 2018-12-04 MED ORDER — ACETAMINOPHEN 650 MG RE SUPP: 650.0000 mg | Freq: Four times a day (QID) | RECTAL | Status: DC | PRN

## 2018-12-04 MED ORDER — IRBESARTAN 75 MG PO TABS
37.5000 mg | ORAL_TABLET | Freq: Every day | ORAL | Status: DC
  Administered 2018-12-05 – 2018-12-07 (×3): 37.5 mg via ORAL
  Filled 2018-12-04 (×4): qty 0.5

## 2018-12-04 MED ORDER — RALOXIFENE HCL 60 MG PO TABS
60.0000 mg | ORAL_TABLET | Freq: Every day | ORAL | Status: DC
  Administered 2018-12-06 – 2018-12-07 (×2): 60 mg via ORAL
  Filled 2018-12-04 (×4): qty 1

## 2018-12-04 MED ORDER — BISACODYL 5 MG PO TBEC: 5.0000 mg | DELAYED_RELEASE_TABLET | Freq: Every day | ORAL | Status: DC | PRN

## 2018-12-04 MED ORDER — PROPAFENONE HCL 225 MG PO TABS
225.0000 mg | ORAL_TABLET | Freq: Two times a day (BID) | ORAL | Status: DC
  Administered 2018-12-05 – 2018-12-07 (×4): 225 mg via ORAL
  Filled 2018-12-04 (×7): qty 1

## 2018-12-04 MED ORDER — DOCUSATE SODIUM 100 MG PO CAPS
100.0000 mg | ORAL_CAPSULE | Freq: Two times a day (BID) | ORAL | Status: DC
  Administered 2018-12-05: 100 mg via ORAL
  Filled 2018-12-04 (×2): qty 1

## 2018-12-04 MED ORDER — PANTOPRAZOLE SODIUM 40 MG PO TBEC
40.0000 mg | DELAYED_RELEASE_TABLET | Freq: Every day | ORAL | Status: DC
  Administered 2018-12-06 – 2018-12-07 (×2): 40 mg via ORAL
  Filled 2018-12-04 (×2): qty 1

## 2018-12-04 MED ORDER — ASPIRIN EC 81 MG PO TBEC
81.0000 mg | DELAYED_RELEASE_TABLET | Freq: Every day | ORAL | Status: DC
  Administered 2018-12-06 – 2018-12-07 (×2): 81 mg via ORAL
  Filled 2018-12-04 (×2): qty 1

## 2018-12-04 MED ORDER — ONDANSETRON HCL 4 MG PO TABS: 4.0000 mg | ORAL_TABLET | Freq: Four times a day (QID) | ORAL | Status: DC | PRN

## 2018-12-04 MED ORDER — DOCUSATE SODIUM 100 MG PO CAPS: 100.0000 mg | ORAL_CAPSULE | Freq: Every day | ORAL | Status: DC | PRN

## 2018-12-04 MED ORDER — AMLODIPINE BESYLATE 5 MG PO TABS
2.5000 mg | ORAL_TABLET | Freq: Every day | ORAL | Status: DC
  Administered 2018-12-04 – 2018-12-06 (×3): 2.5 mg via ORAL
  Filled 2018-12-04 (×3): qty 1

## 2018-12-04 MED ORDER — FENTANYL CITRATE (PF) 100 MCG/2ML IJ SOLN
50.0000 ug | Freq: Once | INTRAMUSCULAR | Status: AC
  Administered 2018-12-04: 50 ug via INTRAVENOUS
  Filled 2018-12-04: qty 2

## 2018-12-04 MED ORDER — VITAMIN B-12 1000 MCG PO TABS
1000.0000 ug | ORAL_TABLET | Freq: Every day | ORAL | Status: DC
  Administered 2018-12-06 – 2018-12-07 (×2): 1000 ug via ORAL
  Filled 2018-12-04 (×2): qty 1

## 2018-12-04 MED ORDER — CELECOXIB 200 MG PO CAPS: 200.0000 mg | ORAL_CAPSULE | Freq: Every day | ORAL | Status: DC

## 2018-12-04 MED ORDER — SODIUM CHLORIDE 0.9 % IV SOLN
INTRAVENOUS | Status: DC
  Administered 2018-12-04: 21:00:00 via INTRAVENOUS

## 2018-12-04 MED ORDER — HYDROMORPHONE HCL 1 MG/ML IJ SOLN: 1.0000 mg | INTRAMUSCULAR | Status: DC | PRN

## 2018-12-04 MED ORDER — CEFAZOLIN SODIUM-DEXTROSE 2-4 GM/100ML-% IV SOLN
2.0000 g | INTRAVENOUS | Status: DC
  Filled 2018-12-04: qty 100

## 2018-12-04 MED ORDER — POLYETHYLENE GLYCOL 3350 17 G PO PACK: 17.0000 g | PACK | Freq: Every day | ORAL | Status: DC

## 2018-12-04 NOTE — H&P (Signed)
[facility] [place] [facility]   PATIENT NAME: Amber Holland    MR#:  [id number]  DATE OF BIRTH:  08/16/1920  DATE OF ADMISSION:  12/04/2018  PRIMARY CARE PHYSICIAN: Lynnea Ferrier, MD   REQUESTING/REFERRING PHYSICIAN: Dr. Marisa Severin  CHIEF COMPLAINT: Hip pain   Chief Complaint  Patient presents with  . Fall  . Hip Pain    HISTORY OF PRESENT ILLNESS:  Amber Holland  is a 85 y.o. female with a known history of central hypertension, chronic A. fib, GERD brought in from [place] [facility] because of fall, noticed left hip pain, found to have a left hip fracture.  Patient fell off side of the bed and noted left hip pain.  Denies any chest pain, dizziness.  Has left hip pain, unable to put weight on the left leg.  PAST MEDICAL HISTORY:   Past Medical History:  Diagnosis Date  . AF (atrial fibrillation) (HCC)   . Arthritis   . Constipation   . Cystocele   . GERD (gastroesophageal reflux disease)   . Hypertension   . Nocturia   . Osteoporosis   . Stroke (HCC) 04/2018   TIA  . Urethral caruncle   . Urinary incontinence   . Vaginal atrophy     PAST SURGICAL HISTOIRY:   Past Surgical History:  Procedure Laterality Date  . CARPAL TUNNEL RELEASE    . NO PAST SURGERIES      SOCIAL HISTORY:   Social History   Tobacco Use  . Smoking status: Never Smoker  . Smokeless tobacco: Never Used  Substance Use Topics  . Alcohol use: Yes    Comment: occas    FAMILY HISTORY:   Family History  Problem Relation Age of Onset  . Cancer Neg Hx   . Diabetes Neg Hx   . Heart disease Neg Hx     DRUG ALLERGIES:   Allergies  Allergen Reactions  . Gabapentin Other (See Comments)    Pt states that she felt weak, trembling and lightheaded    REVIEW OF SYSTEMS:  CONSTITUTIONAL: No fever, fatigue or weakness.  EYES: No blurred or double vision.  EARS, NOSE, AND THROAT: No tinnitus or ear pain.  RESPIRATORY: No cough, shortness of breath,  wheezing or hemoptysis.  CARDIOVASCULAR: No chest pain, orthopnea, edema.  GASTROINTESTINAL: No nausea, vomiting, diarrhea or abdominal pain.  GENITOURINARY: No dysuria, hematuria.  ENDOCRINE: No polyuria, nocturia,  HEMATOLOGY: No anemia, easy bruising or bleeding SKIN: No rash or lesion. MUSCULOSKELETAL: Left hip pain, painful to straighten the left leg NEUROLOGIC: No tingling, numbness, weakness.  PSYCHIATRY: No anxiety or depression.   MEDICATIONS AT HOME:   Prior to Admission medications   Medication Sig Start Date End Date Taking? Authorizing Provider  acetaminophen (TYLENOL) 325 MG tablet Take 650 mg by mouth 3 (three) times daily as needed (Knee pain).    Yes [provider]  amLODipine (NORVASC) 2.5 MG tablet Take 2.5 mg by mouth once daily. 11/30/16  Yes [provider]  aspirin EC 81 MG tablet Take 81 mg by mouth daily.    Yes [provider]  busPIRone (BUSPAR) 15 MG tablet Take 15 mg by mouth 2 (two) times daily. 11/28/18 11/28/19 Yes [provider]  Camphor-Menthol-Methyl Sal 03-09-29 % CREA Apply 1 application topically 2 (two) times daily as needed. 08/17/18  Yes [provider]  candesartan (ATACAND) 32 MG tablet TAKE 1 TABLET DAILY 11/20/14  Yes [provider]  celecoxib (CELEBREX) 200  MG capsule Take 200 mg by mouth daily.  10/15/15  Yes [provider]  diclofenac sodium (VOLTAREN) 1 % GEL Apply 2 g topically 4 (four) times daily as needed (Knee pain).  03/23/17  Yes [provider]  omeprazole (PRILOSEC) 40 MG capsule TAKE 1 CAPSULE DAILY 02/18/15  Yes [provider]  propafenone (RYTHMOL) 225 MG tablet TAKE 1 TABLET BY MOUTH TWICE A DAY 03/04/15  Yes [provider]  raloxifene (EVISTA) 60 MG tablet TAKE 1 TABLET DAILY 05/08/15  Yes [provider]  sertraline (ZOLOFT) 50 MG tablet Take 50 mg by mouth Nightly. 11/28/18 11/28/19 Yes [provider]  trospium (SANCTURA)  20 MG tablet TAKE 1 TABLET TWICE A DAY 03/28/18  Yes Hildred Laser, MD  vitamin B-12 (CYANOCOBALAMIN) 1000 MCG tablet Take 1,000 mcg by mouth daily.    Yes [provider]  acetaminophen (TYLENOL) 650 MG CR tablet Take 650 mg by mouth every 8 (eight) hours as needed for pain.    [provider]  docusate sodium (COLACE) 100 MG capsule Take 100 mg by mouth daily as needed (for constipation if Miralax does not work.).    [provider]  hydrOXYzine (ATARAX/VISTARIL) 25 MG tablet Take 25 mg by mouth 3 (three) times daily as needed for itching.    [provider]  metoprolol tartrate (LOPRESSOR) 25 MG tablet Take 25 mg by mouth 2 (two) times daily as needed.    [provider]  polyethylene glycol (MIRALAX / GLYCOLAX) packet Take 17 g by mouth daily.    [provider]      VITAL SIGNS:  Blood pressure (!) 185/76, pulse 63, temperature 98 F (36.7 C), temperature source Oral, resp. rate 18, height 4\' 10"  (1.473 m), weight 49.9 kg, SpO2 94 %.  PHYSICAL EXAMINATION:  GENERAL:  85 y.o.-year-old patient lying in the bed with no acute distress.  EYES: Pupils equal, round, reactive to light and accommodation. No scleral icterus. Extraocular muscles intact.  HEENT: Head atraumatic, normocephalic. Oropharynx and nasopharynx clear.  NECK:  Supple, no jugular venous distention. No thyroid enlargement, no tenderness.  LUNGS: Normal breath sounds bilaterally, no wheezing, rales,rhonchi or crepitation. No use of accessory muscles of respiration.  CARDIOVASCULAR: S1, S2 normal. No murmurs, rubs, or gallops.  ABDOMEN: Soft, nontender, nondistended. Bowel sounds present. No organomegaly or mass.  EXTREMITIES: No pedal edema, cyanosis, or clubbing.  NEUROLOGIC: Cranial nerves II through XII are intact. Muscle strength 5/5 in all extremities. Sensation intact. Gait not checked.  PSYCHIATRIC: The patient is alert and oriented x 3.  SKIN: No obvious rash,  lesion, or ulcer.   LABORATORY PANEL:   CBC Recent Labs  Lab 12/04/18 1742  WBC 10.2  HGB 11.8*  HCT 37.1  PLT 300   ------------------------------------------------------------------------------------------------------------------  Chemistries  Recent Labs  Lab 12/04/18 1742  NA 137  K 3.1*  CL 101  CO2 28  GLUCOSE 147*  BUN 16  CREATININE 0.64  CALCIUM 8.8*   ------------------------------------------------------------------------------------------------------------------  Cardiac Enzymes No results for input(s): TROPONINI in the last 168 hours. ------------------------------------------------------------------------------------------------------------------  RADIOLOGY:  Dg Hip Unilat W Or Wo Pelvis 2-3 Views Left  Result Date: 12/04/2018 CLINICAL DATA:  Left hip pain due to an injury suffered in a fall today. Initial encounter. EXAM: DG HIP (WITH OR WITHOUT PELVIS) 2-3V LEFT COMPARISON:  None. FINDINGS: The patient has a mildly impacted subcapital fracture of the left hip. No other acute bony or joint abnormality is identified. Postoperative change of L4  vertebral augmentation noted. IMPRESSION: Acute subcapital fracture left hip. Electronically Signed   By: Drusilla Kannerhomas  Dalessio M.D.   On: 12/04/2018 17:37    EKG:   Orders placed or performed during the hospital encounter of 12/04/18  . ED EKG  . ED EKG  . EKG 12-Lead  . EKG 12-Lead  Normal sinus rhythm with 60 bpm, no ST-T changes.  IMPRESSION AND PLAN:   85 year old female with essential hypertension, chronic A. fib [facility], left hip pain now has left hip fracture.  #1/ left hip fracture, initial, closed, patient is at high risk for planned surgery because of advanced age with hypertension, chronic A. fib.   2 chronic A. fib, rate controlled on Rythmol, on aspirin alone, not on warfarin due to fall, history of previous hematoma in 2011. 3.  Mild Alzheimer's dementia, patient  very alert, awake, able to communicate well with family members and also to me.  All the records are reviewed and case discussed with ED provider. Management plans discussed with the patient, family and they are in agreement.  CODE STATUS: DNR  TOTAL TIME TAKING CARE OF THIS PATIENT: 55minutes.  Spoke with  with patient's daughter at bedside  Katha HammingSnehalatha Mishawn Hemann M.D on 12/04/2018 at 6:49 PM  Between 7am to 6pm - Pager - [phone]  After 6pm go to [email or web address] - password [email or web address] [facility]  [facility]  Office  [phone]  CC: Primary care physician; Lynnea FerrierKlein, Bert J III, MD  Note: This dictation was prepared with Dragon dictation along with smaller phrase technology. Any transcriptional errors that result from this process are unintentional.

## 2018-12-04 NOTE — ED Notes (Signed)
PureWick placed.

## 2018-12-04 NOTE — ED Provider Notes (Signed)
[facility] Emergency Department Provider Note ____________________________________________   First MD Initiated Contact with Patient 12/04/18 1601     (approximate)  I have reviewed the triage vital signs and the nursing notes.   HISTORY  Chief Complaint Fall and Hip Pain    HPI Amber Holland is a 85 y.o. female with PMH as noted below who presents with left hip injury, acute onset this morning when the patient rolled and fell out of her bed, and associated with inability to bear weight as well as pain on bending or straightening the hip.  She denies hitting her head and has no back pain or injuries elsewhere.  She denies any weakness or lightheadedness.  Past Medical History:  Diagnosis Date  . AF (atrial fibrillation) (HCC)   . Arthritis   . Constipation   . Cystocele   . GERD (gastroesophageal reflux disease)   . Hypertension   . Nocturia   . Osteoporosis   . Stroke (HCC) 04/2018   TIA  . Urethral caruncle   . Urinary incontinence   . Vaginal atrophy     Patient Active Problem List   Diagnosis Date Noted  . Closed left hip fracture (HCC) 12/04/2018  . Osteoarthritis of knee (Left) 04/03/2018  . Chronic knee pain (Primary Area of Pain) (Left) 03/31/2018  . Chronic pain syndrome 03/31/2018  . Pharmacologic therapy 03/31/2018  . Disorder of skeletal system 03/31/2018  . Problems influencing health status 03/31/2018  . Vaginal pessary in situ 03/16/2016  . Urinary incontinence 08/27/2015  . Cystocele, midline 08/27/2015  . Bilateral carpal tunnel syndrome 01/30/2015  . B12 deficiency 12/25/2014  . Hereditary and idiopathic neuropathy 12/25/2014  . Peripheral polyneuropathy 12/25/2014  . Atrial fibrillation (HCC) 05/09/2014  . Benign essential hypertension 05/09/2014    Past Surgical History:  Procedure Laterality Date  . CARPAL TUNNEL RELEASE    . NO PAST SURGERIES      Prior to Admission medications   Medication Sig  Start Date End Date Taking? Authorizing Provider  acetaminophen (TYLENOL) 325 MG tablet Take 650 mg by mouth 3 (three) times daily as needed (Knee pain).    Yes [provider]  amLODipine (NORVASC) 2.5 MG tablet Take 2.5 mg by mouth once daily. 11/30/16  Yes [provider]  aspirin EC 81 MG tablet Take 81 mg by mouth daily.    Yes [provider]  busPIRone (BUSPAR) 15 MG tablet Take 15 mg by mouth 2 (two) times daily. 11/28/18 11/28/19 Yes [provider]  Camphor-Menthol-Methyl Sal 03-09-29 % CREA Apply 1 application topically 2 (two) times daily as needed. 08/17/18  Yes [provider]  candesartan (ATACAND) 32 MG tablet TAKE 1 TABLET DAILY 11/20/14  Yes [provider]  celecoxib (CELEBREX) 200 MG capsule Take 200 mg by mouth daily.  10/15/15  Yes [provider]  diclofenac sodium (VOLTAREN) 1 % GEL Apply 2 g topically 4 (four) times daily as needed (Knee pain).  03/23/17  Yes [provider]  omeprazole (PRILOSEC) 40 MG capsule TAKE 1 CAPSULE DAILY 02/18/15  Yes [provider]  propafenone (RYTHMOL) 225 MG tablet TAKE 1 TABLET BY MOUTH TWICE A DAY 03/04/15  Yes [provider]  raloxifene (EVISTA) 60 MG tablet TAKE 1 TABLET DAILY 05/08/15  Yes [provider]  sertraline (ZOLOFT) 50 MG tablet Take 50 mg by mouth Nightly. 11/28/18 11/28/19 Yes [provider]  trospium (SANCTURA) 20 MG tablet TAKE 1 TABLET TWICE A DAY 03/28/18  Yes Hildred Laser, MD  vitamin B-12 (CYANOCOBALAMIN) 1000 MCG tablet Take 1,000 mcg by mouth daily.    Yes [provider]  acetaminophen (TYLENOL) 650 MG CR tablet Take 650 mg by mouth every 8 (eight) hours as needed for pain.    [provider]  docusate sodium (COLACE) 100 MG capsule Take 100 mg by mouth daily as needed (for constipation if Miralax does not work.).    [provider]  hydrOXYzine (ATARAX/VISTARIL) 25 MG tablet Take 25 mg by  mouth 3 (three) times daily as needed for itching.    [provider]  metoprolol tartrate (LOPRESSOR) 25 MG tablet Take 25 mg by mouth 2 (two) times daily as needed.    [provider]  polyethylene glycol (MIRALAX / GLYCOLAX) packet Take 17 g by mouth daily.    [provider]    Allergies Gabapentin  Family History  Problem Relation Age of Onset  . Cancer Neg Hx   . Diabetes Neg Hx   . Heart disease Neg Hx     Social History Social History   Tobacco Use  . Smoking status: Never Smoker  . Smokeless tobacco: Never Used  Substance Use Topics  . Alcohol use: Yes    Comment: occas  . Drug use: No    Review of Systems  Constitutional: No fever.  No weakness. Eyes: No eye injury. ENT: No neck pain. Cardiovascular: Denies chest pain. Respiratory: Denies shortness of breath. Gastrointestinal: No vomiting. Genitourinary: Negative for flank pain.  Musculoskeletal: Negative for back pain.  Positive for left hip injury. Skin: Negative for rash. Neurological: Negative for headache.  Negative for focal weakness or numbness.   ____________________________________________   PHYSICAL EXAM:  VITAL SIGNS: ED Triage Vitals  Enc Vitals Group     BP 12/04/18 1553 (!) 181/86     Pulse Rate 12/04/18 1553 65     Resp 12/04/18 1553 18     Temp 12/04/18 1553 98 F (36.7 C)     Temp Source 12/04/18 1553 Oral     SpO2 12/04/18 1553 94 %     Weight 12/04/18 1551 110 lb (49.9 kg)     Height 12/04/18 1551 4\' 10"  (1.473 m)     Head Circumference --      Peak Flow --      Pain Score 12/04/18 1550 8     Pain Loc --      Pain Edu? --      Excl. in GC? --     Constitutional: Alert and oriented. Well appearing for age and in no acute distress. Eyes: Conjunctivae are normal.  Head: Atraumatic. Nose: No congestion/rhinnorhea. Mouth/Throat: Mucous membranes are moist.   Neck: Normal range of motion.  Cardiovascular: Good peripheral circulation.  No chest  wall tenderness. Respiratory: Normal respiratory effort.  Gastrointestinal: Soft and nontender. No distention.  Genitourinary: No flank tenderness. Musculoskeletal: No lower extremity edema.  Extremities warm and well perfused.  Left lower extremity shortened and held in flexion.  Pain on any range of motion of left hip.  Tenderness anteriorly in the inguinal region.  2+ distal pulses. Neurologic:  Normal speech and language.  5/5 motor strength and intact sensation to bilateral lower extremities.   Skin:  Skin is warm and dry. No rash noted. Psychiatric: Mood and affect are normal. Speech and behavior are normal.  ____________________________________________   LABS (all labs ordered are listed, but only abnormal results are displayed)  Labs Reviewed  BASIC METABOLIC PANEL -  Abnormal; Notable for the following components:      Result Value   Potassium 3.1 (*)    Glucose, Bld 147 (*)    Calcium 8.8 (*)    All other components within normal limits  CBC WITH DIFFERENTIAL/PLATELET - Abnormal; Notable for the following components:   Hemoglobin 11.8 (*)    RDW 15.7 (*)    Neutro Abs 7.8 (*)    All other components within normal limits  PROTIME-INR  CBC  SAMPLE TO BLOOD BANK   ____________________________________________  EKG  ED ECG REPORT I, Dionne Bucy, the attending physician, personally viewed and interpreted this ECG.  Date: 12/04/2018 EKG Time: 1829 Rate: 60 Rhythm: normal sinus rhythm QRS Axis: normal Intervals: normal ST/T Wave abnormalities: Nonspecific ST abnormalities anteriorly Narrative Interpretation: Nonspecific ST abnormalities with no evidence of acute ischemia    ____________________________________________  RADIOLOGY  XR L hip: Subcapital left femoral neck fracture  ____________________________________________   PROCEDURES  Procedure(s) performed: No  Procedures  Critical Care performed:  No ____________________________________________   INITIAL IMPRESSION / ASSESSMENT AND PLAN / ED COURSE  Pertinent labs & imaging results that were available during my care of the patient were reviewed by me and considered in my medical decision making (see chart for details).  85 year old female with PMH as noted above presents with left hip injury after a fall out of her bed this morning which she describes as mechanical and not precipitated by any weakness or syncope.  She has been unable to bear weight on the left leg.  Denies other injuries.  On exam the left hip is tender to palpation and she has pain with any range of motion.  The leg is shortened and held in flexion.  The extremity is neuro/vascular intact.  I am concerned for left hip fracture.  We will obtain x-rays and reassess.  ----------------------------------------- 6:18 PM on 12/04/2018 -----------------------------------------  X-ray shows subcapital femoral neck fracture.  I consulted Dr. Joice Lofts who states he will operate on the patient tomorrow.  I signed the patient out to the hospitalist for admission. ____________________________________________   FINAL CLINICAL IMPRESSION(S) / ED DIAGNOSES  Final diagnoses:  Closed subcapital fracture of left femur, initial encounter (HCC)      NEW MEDICATIONS STARTED DURING THIS VISIT:  New Prescriptions   No medications on file     Note:  This document was prepared using Dragon voice recognition software and may include unintentional dictation errors.    Dionne Bucy, MD 12/04/18 1902

## 2018-12-04 NOTE — ED Triage Notes (Signed)
Pt arrives via pov, pt fell off the side of the bed, pt is c/o left hip and is painful to straighten the left leg, pt unable to bear weight on the leg getting out of the car

## 2018-12-05 ENCOUNTER — Encounter
Admission: EM | Disposition: A | Discharge status: Skilled Nursing Facility | Payer: Self-pay | Source: Home / Self Care | Attending: Internal Medicine

## 2018-12-05 ENCOUNTER — Inpatient Hospital Stay: Payer: Medicare Other | Admitting: Anesthesiology

## 2018-12-05 ENCOUNTER — Inpatient Hospital Stay: Payer: Medicare Other

## 2018-12-05 HISTORY — PX: HIP PINNING,CANNULATED: SHX1758

## 2018-12-05 LAB — URINALYSIS, COMPLETE (UACMP) WITH MICROSCOPIC
Bilirubin Urine: NEGATIVE
Glucose, UA: NEGATIVE mg/dL
Hgb urine dipstick: NEGATIVE
Ketones, ur: NEGATIVE mg/dL
Nitrite: NEGATIVE
Protein, ur: NEGATIVE mg/dL
Specific Gravity, Urine: 1.012 (ref 1.005–1.030)
pH: 6 (ref 5.0–8.0)

## 2018-12-05 LAB — TYPE AND SCREEN
ABO/RH(D): O POS
Antibody Screen: NEGATIVE

## 2018-12-05 LAB — CBC
HCT: 33.2 % — ABNORMAL LOW (ref 36.0–46.0)
HEMOGLOBIN: 10.6 g/dL — AB (ref 12.0–15.0)
MCH: 28.3 pg (ref 26.0–34.0)
MCHC: 31.9 g/dL (ref 30.0–36.0)
MCV: 88.5 fL (ref 80.0–100.0)
NRBC: 0 % (ref 0.0–0.2)
Platelets: 259 10*3/uL (ref 150–400)
RBC: 3.75 MIL/uL — ABNORMAL LOW (ref 3.87–5.11)
RDW: 15.8 % — ABNORMAL HIGH (ref 11.5–15.5)
WBC: 6.5 10*3/uL (ref 4.0–10.5)

## 2018-12-05 LAB — SURGICAL PCR SCREEN
MRSA, PCR: NEGATIVE
Staphylococcus aureus: POSITIVE — AB

## 2018-12-05 LAB — GLUCOSE, CAPILLARY: GLUCOSE-CAPILLARY: 87 mg/dL (ref 70–99)

## 2018-12-05 LAB — ABO/RH: ABO/RH(D): O POS

## 2018-12-05 SURGERY — FIXATION, FEMUR, NECK, PERCUTANEOUS, USING SCREW
Anesthesia: Spinal | Laterality: Left

## 2018-12-05 MED ORDER — SODIUM CHLORIDE 0.9 % IV SOLN
INTRAVENOUS | Status: DC
  Administered 2018-12-05 – 2018-12-06 (×2): via INTRAVENOUS

## 2018-12-05 MED ORDER — DOCUSATE SODIUM 100 MG PO CAPS
100.0000 mg | ORAL_CAPSULE | Freq: Two times a day (BID) | ORAL | Status: DC
  Administered 2018-12-06 – 2018-12-07 (×2): 100 mg via ORAL
  Filled 2018-12-05 (×3): qty 1

## 2018-12-05 MED ORDER — CEFAZOLIN SODIUM-DEXTROSE 2-3 GM-%(50ML) IV SOLR
INTRAVENOUS | Status: DC | PRN
  Administered 2018-12-05: 2 g via INTRAVENOUS

## 2018-12-05 MED ORDER — PROPOFOL 10 MG/ML IV BOLUS
INTRAVENOUS | Status: AC
  Filled 2018-12-05: qty 40

## 2018-12-05 MED ORDER — ACETAMINOPHEN 500 MG PO TABS
500.0000 mg | ORAL_TABLET | Freq: Four times a day (QID) | ORAL | Status: AC
  Administered 2018-12-05 – 2018-12-06 (×4): 500 mg via ORAL
  Filled 2018-12-05 (×4): qty 1

## 2018-12-05 MED ORDER — TRAMADOL HCL 50 MG PO TABS
50.0000 mg | ORAL_TABLET | Freq: Four times a day (QID) | ORAL | Status: DC | PRN
  Administered 2018-12-06: 50 mg via ORAL
  Filled 2018-12-05: qty 1

## 2018-12-05 MED ORDER — FENTANYL CITRATE (PF) 100 MCG/2ML IJ SOLN: 25.0000 ug | INTRAMUSCULAR | Status: DC | PRN

## 2018-12-05 MED ORDER — METOCLOPRAMIDE HCL 10 MG PO TABS: 5.0000 mg | ORAL_TABLET | Freq: Three times a day (TID) | ORAL | Status: DC | PRN

## 2018-12-05 MED ORDER — BUPIVACAINE HCL (PF) 0.5 % IJ SOLN
INTRAMUSCULAR | Status: DC | PRN
  Administered 2018-12-05: 2.5 mL

## 2018-12-05 MED ORDER — DIPHENHYDRAMINE HCL 12.5 MG/5ML PO ELIX: 12.5000 mg | ORAL_SOLUTION | ORAL | Status: DC | PRN

## 2018-12-05 MED ORDER — KETAMINE HCL 50 MG/ML IJ SOLN
INTRAMUSCULAR | Status: DC | PRN
  Administered 2018-12-05: 25 mg via INTRAMUSCULAR

## 2018-12-05 MED ORDER — BUPIVACAINE HCL (PF) 0.5 % IJ SOLN
INTRAMUSCULAR | Status: AC
  Filled 2018-12-05: qty 10

## 2018-12-05 MED ORDER — ENOXAPARIN SODIUM 30 MG/0.3ML ~~LOC~~ SOLN
30.0000 mg | SUBCUTANEOUS | Status: DC
  Administered 2018-12-06 – 2018-12-07 (×2): 30 mg via SUBCUTANEOUS
  Filled 2018-12-05 (×2): qty 0.3

## 2018-12-05 MED ORDER — ONDANSETRON HCL 4 MG/2ML IJ SOLN: 4.0000 mg | Freq: Once | INTRAMUSCULAR | Status: DC | PRN

## 2018-12-05 MED ORDER — MAGNESIUM HYDROXIDE 400 MG/5ML PO SUSP: 30.0000 mL | Freq: Every day | ORAL | Status: DC | PRN

## 2018-12-05 MED ORDER — BUPIVACAINE-EPINEPHRINE (PF) 0.5% -1:200000 IJ SOLN
INTRAMUSCULAR | Status: DC | PRN
  Administered 2018-12-05: 20 mL

## 2018-12-05 MED ORDER — CEFAZOLIN SODIUM-DEXTROSE 2-4 GM/100ML-% IV SOLN
INTRAVENOUS | Status: AC
  Filled 2018-12-05: qty 100

## 2018-12-05 MED ORDER — FLEET ENEMA 7-19 GM/118ML RE ENEM: 1.0000 | ENEMA | Freq: Once | RECTAL | Status: DC | PRN

## 2018-12-05 MED ORDER — BISACODYL 10 MG RE SUPP: 10.0000 mg | Freq: Every day | RECTAL | Status: DC | PRN

## 2018-12-05 MED ORDER — ACETAMINOPHEN 325 MG PO TABS
650.0000 mg | ORAL_TABLET | Freq: Four times a day (QID) | ORAL | Status: DC | PRN
  Administered 2018-12-07: 650 mg via ORAL
  Filled 2018-12-05: qty 2

## 2018-12-05 MED ORDER — METOCLOPRAMIDE HCL 5 MG/ML IJ SOLN: 5.0000 mg | Freq: Three times a day (TID) | INTRAMUSCULAR | Status: DC | PRN

## 2018-12-05 MED ORDER — PROPOFOL 500 MG/50ML IV EMUL
INTRAVENOUS | Status: DC | PRN
  Administered 2018-12-05: 50 ug/kg/min via INTRAVENOUS

## 2018-12-05 MED ORDER — ONDANSETRON HCL 4 MG PO TABS: 4.0000 mg | ORAL_TABLET | Freq: Four times a day (QID) | ORAL | Status: DC | PRN

## 2018-12-05 MED ORDER — CEFAZOLIN SODIUM-DEXTROSE 2-4 GM/100ML-% IV SOLN
2.0000 g | Freq: Four times a day (QID) | INTRAVENOUS | Status: AC
  Administered 2018-12-05 – 2018-12-06 (×3): 2 g via INTRAVENOUS
  Filled 2018-12-05 (×3): qty 100

## 2018-12-05 MED ORDER — MUPIROCIN 2 % EX OINT
1.0000 "application " | TOPICAL_OINTMENT | Freq: Two times a day (BID) | CUTANEOUS | Status: DC
  Administered 2018-12-05 – 2018-12-07 (×4): 1 via NASAL
  Filled 2018-12-05: qty 22

## 2018-12-05 MED ORDER — CHLORHEXIDINE GLUCONATE CLOTH 2 % EX PADS
6.0000 | MEDICATED_PAD | Freq: Every day | CUTANEOUS | Status: DC
  Administered 2018-12-05 – 2018-12-06 (×2): 6 via TOPICAL

## 2018-12-05 MED ORDER — KETAMINE HCL 50 MG/ML IJ SOLN
INTRAMUSCULAR | Status: AC
  Filled 2018-12-05: qty 10

## 2018-12-05 MED ORDER — ONDANSETRON HCL 4 MG/2ML IJ SOLN: 4.0000 mg | Freq: Four times a day (QID) | INTRAMUSCULAR | Status: DC | PRN

## 2018-12-05 MED ORDER — NEOMYCIN-POLYMYXIN B GU 40-200000 IR SOLN
Status: DC | PRN
  Administered 2018-12-05: 2 mL

## 2018-12-05 MED ORDER — POTASSIUM CHLORIDE CRYS ER 20 MEQ PO TBCR
20.0000 meq | EXTENDED_RELEASE_TABLET | Freq: Two times a day (BID) | ORAL | Status: DC
  Administered 2018-12-05 – 2018-12-06 (×2): 20 meq via ORAL
  Filled 2018-12-05 (×2): qty 1

## 2018-12-05 SURGICAL SUPPLY — 33 items
BIT DRILL CANN LRG QC 5X300 (BIT) ×2 IMPLANT
BNDG COHESIVE 4X5 TAN STRL (GAUZE/BANDAGES/DRESSINGS) ×3 IMPLANT
BNDG COHESIVE 6X5 TAN STRL LF (GAUZE/BANDAGES/DRESSINGS) ×3 IMPLANT
CANISTER SUCT 1200ML W/VALVE (MISCELLANEOUS) ×3 IMPLANT
CHLORAPREP W/TINT 26ML (MISCELLANEOUS) ×6 IMPLANT
COVER WAND RF STERILE (DRAPES) ×1 IMPLANT
DRSG OPSITE POSTOP 3X4 (GAUZE/BANDAGES/DRESSINGS) ×3 IMPLANT
ELECT REM PT RETURN 9FT ADLT (ELECTROSURGICAL) ×3
ELECTRODE REM PT RTRN 9FT ADLT (ELECTROSURGICAL) ×1 IMPLANT
GLOVE BIO SURGEON STRL SZ8 (GLOVE) ×6 IMPLANT
GLOVE INDICATOR 8.0 STRL GRN (GLOVE) ×3 IMPLANT
GOWN STRL REUS W/ TWL LRG LVL3 (GOWN DISPOSABLE) ×1 IMPLANT
GOWN STRL REUS W/ TWL XL LVL3 (GOWN DISPOSABLE) ×1 IMPLANT
GOWN STRL REUS W/TWL LRG LVL3 (GOWN DISPOSABLE) ×2
GOWN STRL REUS W/TWL XL LVL3 (GOWN DISPOSABLE) ×2
GUIDEWIRE THREADED 2.8MM (WIRE) ×6 IMPLANT
NDL FILTER BLUNT 18X1 1/2 (NEEDLE) ×1 IMPLANT
NEEDLE FILTER BLUNT 18X 1/2SAF (NEEDLE) ×2
NEEDLE FILTER BLUNT 18X1 1/2 (NEEDLE) ×1 IMPLANT
NEEDLE HYPO 22GX1.5 SAFETY (NEEDLE) ×3 IMPLANT
PACK HIP COMPR (MISCELLANEOUS) ×3 IMPLANT
SCREW CANN 16 THRD/85 6.5 (Screw) ×2 IMPLANT
SCREW CANN 6.5X75MM (Screw) ×2 IMPLANT
SCREW CANN 6.5X80MM (Screw) ×4 IMPLANT
STAPLER SKIN PROX 35W (STAPLE) ×3 IMPLANT
STRAP SAFETY 5IN WIDE (MISCELLANEOUS) ×3 IMPLANT
SUT PROLENE 2 0 FS (SUTURE) IMPLANT
SUT VIC AB 0 CT1 36 (SUTURE) ×3 IMPLANT
SUT VIC AB 0 SH 27 (SUTURE) IMPLANT
SUT VIC AB 2-0 CT1 27 (SUTURE) ×2
SUT VIC AB 2-0 CT1 TAPERPNT 27 (SUTURE) ×1 IMPLANT
SYR 20CC LL (SYRINGE) ×3 IMPLANT
SYR 5ML LL (SYRINGE) ×3 IMPLANT

## 2018-12-05 NOTE — Consult Note (Signed)
ORTHOPAEDIC CONSULTATION  REQUESTING PHYSICIAN: Altamese Dilling, *  Chief Complaint:   Left hip pain.  History of Present Illness: Amber Holland is a 85 y.o. female with a history of hypertension, osteoporosis, gastroesophageal reflux disease, atrial fibrillation, and status post a stroke who lives at an assisted living facility.  Overall, the patient was in her usual state of health when she apparently lost her balance and fell yesterday morning, landing on her left hip.  The nursing staff was able to get her back to the bed.  Her family was notified.  They came over and then brought her to church in a wheelchair.  The patient seemed to be doing well but began to have increased discomfort in her left hip upon trying to weight-bear after returning to the assisted living facility, so they brought her to the emergency room where x-rays demonstrated an impacted left femoral neck fracture.  The patient denies any associated injuries resulting from her fall yesterday morning.  She did not strike her head or lose consciousness.  She also denies any lightheadedness, dizziness, chest pain, shortness of breath, or other symptoms which may have precipitated her fall.  Past Medical History:  Diagnosis Date  . AF (atrial fibrillation) (HCC)   . Arthritis   . Constipation   . Cystocele   . GERD (gastroesophageal reflux disease)   . Hypertension   . Nocturia   . Osteoporosis   . Stroke (HCC) 04/2018   TIA  . Urethral caruncle   . Urinary incontinence   . Vaginal atrophy    Past Surgical History:  Procedure Laterality Date  . CARPAL TUNNEL RELEASE    . NO PAST SURGERIES     Social History   Socioeconomic History  . Marital status: Widowed    Spouse name: Not on file  . Number of children: Not on file  . Years of education: Not on file  . Highest education level: Not on file  Occupational History  . Not on file   Social Needs  . Financial resource strain: Not on file  . Food insecurity:    Worry: Not on file    Inability: Not on file  . Transportation needs:    Medical: Not on file    Non-medical: Not on file  Tobacco Use  . Smoking status: Never Smoker  . Smokeless tobacco: Never Used  Substance and Sexual Activity  . Alcohol use: Yes    Comment: occas  . Drug use: No  . Sexual activity: Not Currently    Birth control/protection: Post-menopausal  Lifestyle  . Physical activity:    Days per week: Not on file    Minutes per session: Not on file  . Stress: Not on file  Relationships  . Social connections:    Talks on phone: Not on file    Gets together: Not on file    Attends religious service: Not on file    Active member of club or organization: Not on file    Attends meetings of clubs or organizations: Not on file    Relationship status: Not on file  Other Topics Concern  . Not on file  Social History Narrative  . Not on file   Family History  Problem Relation Age of Onset  . Cancer Neg Hx   . Diabetes Neg Hx   . Heart disease Neg Hx    Allergies  Allergen Reactions  . Gabapentin Other (See Comments)    Pt states that she felt weak,  trembling and lightheaded   Prior to Admission medications   Medication Sig Start Date End Date Taking? Authorizing Provider  acetaminophen (TYLENOL) 325 MG tablet Take 650 mg by mouth 3 (three) times daily as needed (Knee pain).    Yes [provider]  amLODipine (NORVASC) 2.5 MG tablet Take 2.5 mg by mouth once daily. 11/30/16  Yes [provider]  aspirin EC 81 MG tablet Take 81 mg by mouth daily.    Yes [provider]  busPIRone (BUSPAR) 15 MG tablet Take 15 mg by mouth 2 (two) times daily. 11/28/18 11/28/19 Yes [provider]  Camphor-Menthol-Methyl Sal 03-09-29 % CREA Apply 1 application topically 2 (two) times daily as needed. 08/17/18  Yes [provider]  candesartan (ATACAND) 32 MG tablet  TAKE 1 TABLET DAILY 11/20/14  Yes [provider]  celecoxib (CELEBREX) 200 MG capsule Take 200 mg by mouth daily.  10/15/15  Yes [provider]  diclofenac sodium (VOLTAREN) 1 % GEL Apply 2 g topically 4 (four) times daily as needed (Knee pain).  03/23/17  Yes [provider]  omeprazole (PRILOSEC) 40 MG capsule TAKE 1 CAPSULE DAILY 02/18/15  Yes [provider]  propafenone (RYTHMOL) 225 MG tablet TAKE 1 TABLET BY MOUTH TWICE A DAY 03/04/15  Yes [provider]  raloxifene (EVISTA) 60 MG tablet TAKE 1 TABLET DAILY 05/08/15  Yes [provider]  sertraline (ZOLOFT) 50 MG tablet Take 50 mg by mouth Nightly. 11/28/18 11/28/19 Yes [provider]  trospium (SANCTURA) 20 MG tablet TAKE 1 TABLET TWICE A DAY 03/28/18  Yes Hildred Laserherry, Anika, MD  vitamin B-12 (CYANOCOBALAMIN) 1000 MCG tablet Take 1,000 mcg by mouth daily.    Yes [provider]  acetaminophen (TYLENOL) 650 MG CR tablet Take 650 mg by mouth every 8 (eight) hours as needed for pain.    [provider]  docusate sodium (COLACE) 100 MG capsule Take 100 mg by mouth daily as needed (for constipation if Miralax does not work.).    [provider]  hydrOXYzine (ATARAX/VISTARIL) 25 MG tablet Take 25 mg by mouth 3 (three) times daily as needed for itching.    [provider]  metoprolol tartrate (LOPRESSOR) 25 MG tablet Take 25 mg by mouth 2 (two) times daily as needed.    [provider]  polyethylene glycol (MIRALAX / GLYCOLAX) packet Take 17 g by mouth daily.    [provider]   Dg Hip Unilat W Or Wo Pelvis 2-3 Views Left  Result Date: 12/04/2018 CLINICAL DATA:  Left hip pain due to an injury suffered in a fall today. Initial encounter. EXAM: DG HIP (WITH OR WITHOUT PELVIS) 2-3V LEFT COMPARISON:  None. FINDINGS: The patient has a mildly impacted subcapital fracture of the left hip. No other acute bony or joint abnormality is identified.  Postoperative change of L4 vertebral augmentation noted. IMPRESSION: Acute subcapital fracture left hip. Electronically Signed   By: Drusilla Kannerhomas  Dalessio M.D.   On: 12/04/2018 17:37    Positive ROS: All other systems have been reviewed and were otherwise negative with the exception of those mentioned in the HPI and as above.  Physical Exam: General:  Alert, no acute distress Psychiatric:  Patient is competent for consent with normal mood and affect   Cardiovascular:  No pedal edema Respiratory:  No wheezing, non-labored breathing GI:  Abdomen is soft and non-tender Skin:  No lesions in the area of chief complaint Neurologic:  Sensation intact distally Lymphatic:  No  axillary or cervical lymphadenopathy  Orthopedic Exam:  Orthopedic examination is limited to the left hip and lower extremity.  The left hip is held in a slightly flexed and internally rotated position for comfort.  Skin inspection around the left hip is notable for some ecchymosis over the lateral aspect of the hip but otherwise is unremarkable.  There is no swelling, erythema, rashes, or other evidence of skin breakdown noted.  She is mildly tender to palpation over the lateral aspect of the hip.  She has more moderate pain with any attempted active or passive motion of the hip.  She is neurovascularly intact to the left lower extremity and foot, demonstrating the ability to actively dorsiflex and plantarflex her toes and ankle.  Sensations intact light touch to all distributions of her left lower extremity and foot.  She has good capillary refill to her left foot.  X-rays:  X-rays of the pelvis and left hip are available for review and have been reviewed by myself.  These films demonstrate an impacted left femoral neck fracture.  Overall alignment appears to be excellent in both the AP and lateral projections, and the fracture appears to be in a stable position.  No significant degenerative changes are noted.  No lytic lesions or  fractures are identified.  Assessment: Impacted left femoral neck fracture.  Plan: The treatment options have been discussed with the patient and her family, who is at the bedside, including her daughter and her son-in-law.  The treatment options discussed including both surgical and nonsurgical options.  The patient and her family would like to proceed with surgical intervention to include an in situ cannulated screw fixation of the fracture.  This procedure has been discussed in detail with the patient, as have the potential risks (including bleeding, infection, nerve and/or blood vessel injury, persistent recurrent pain, stiffness, malunion and/or nonunion, avascular necrosis, need for further surgery, blood clots, strokes, heart attacks and arrhythmias, etc.) and benefits.  The patient and her family state their understanding and wish to proceed.  A formal written consent will be obtained by the nursing staff.  Thank you for asking me to participate in the care of this most pleasant woman.  I will be happy to follow her with you.   Maryagnes Amos, MD  Beeper #:  [phone]  12/05/2018 7:47 AM

## 2018-12-05 NOTE — Anesthesia Preprocedure Evaluation (Addendum)
Anesthesia Evaluation  Patient identified by MRN, date of birth, ID band Patient awake    Reviewed: Allergy & Precautions, NPO status , Patient's Chart, lab work & pertinent test results  History of Anesthesia Complications Negative for: history of anesthetic complications  Airway Mallampati: II  TM Distance: >3 FB Neck ROM: Full    Dental  (+) Missing   Pulmonary neg pulmonary ROS, neg sleep apnea, neg COPD,    breath sounds clear to auscultation- rhonchi (-) wheezing      Cardiovascular hypertension, Pt. on medications (-) CAD, (-) Past MI, (-) Cardiac Stents and (-) CABG + dysrhythmias Atrial Fibrillation  Rhythm:Regular Rate:Normal - Systolic murmurs and - Diastolic murmurs    Neuro/Psych Dementia CVA, No Residual Symptoms    GI/Hepatic Neg liver ROS, GERD  ,  Endo/Other  negative endocrine ROSneg diabetes  Renal/GU negative Renal ROS     Musculoskeletal  (+) Arthritis ,   Abdominal (+) - obese,   Peds  Hematology negative hematology ROS (+)   Anesthesia Other Findings Past Medical History: No date: AF (atrial fibrillation) (HCC) No date: Arthritis No date: Constipation No date: Cystocele No date: GERD (gastroesophageal reflux disease) No date: Hypertension No date: Nocturia No date: Osteoporosis 04/2018: Stroke [facility])     Comment:  TIA No date: Urethral caruncle No date: Urinary incontinence No date: Vaginal atrophy   Reproductive/Obstetrics                             Lab Results  Component Value Date   WBC 6.5 12/05/2018   HGB 10.6 (L) 12/05/2018   HCT 33.2 (L) 12/05/2018   MCV 88.5 12/05/2018   PLT 259 12/05/2018    Anesthesia Physical Anesthesia Plan  ASA: III  Anesthesia Plan: Spinal   Post-op Pain Management:    Induction:   PONV Risk Score and Plan: 2 and Propofol infusion  Airway Management Planned: Natural Airway  Additional Equipment:   Intra-op  Plan:   Post-operative Plan:   Informed Consent: I have reviewed the patients History and Physical, chart, labs and discussed the procedure including the risks, benefits and alternatives for the proposed anesthesia with the patient or authorized representative who has indicated his/her understanding and acceptance.   Dental advisory given  Plan Discussed with: CRNA and Anesthesiologist  Anesthesia Plan Comments:         Anesthesia Quick Evaluation

## 2018-12-05 NOTE — Op Note (Signed)
12/05/2018  2:28 PM  Patient:   Amber Holland  Pre-Op Diagnosis:   Impacted left femoral neck fracture.  Post-Op Diagnosis:   Same.  Procedure:   In situ cannulated screw fixation of impacted left femoral neck fracture.  Surgeon:   Maryagnes Amos, MD  Assistant:   None  Anesthesia:   Spinal  Findings:   As above.  Complications:   None  EBL:   5 cc  Fluids:   800 cc crystalloid  UOP:   None  TT:   None  Drains:   None  Closure:   Staples  Implants:   Biomet 6.5 mm cannulated screws (16 mm) 3  Brief Clinical Note:   The patient is a 85 year old female who sustained the above-noted injury yesterday morning when she apparently fell while at her assisted living facility. Because of continued pain, she was brought to the emergency room where x-rays demonstrated the above-noted injury. The patient has been cleared medically and presents at this time for definitive management of this injury.  Procedure:   The patient was brought into the operating room. After adequate spinal anesthesia was obtained, the patient was lain in the supine position on the fracture table. The uninjured leg was placed in a flexed and abducted position over the well-leg holder while the operative leg was placed in gentle longitudinal traction with some internal rotation. The adequacy of fracture position was verified fluoroscopically in AP and lateral projections before the lateral aspect of the left hip and thigh were prepped with ChloraPrep solution and draped sterilely. Preoperative antibiotics were administered. An approximately 3-4 cm incision was made over the lateral aspect of the lower part of the greater trochanter as verified fluoroscopically. This incision was carried down through the subcutaneous cutaneous tissues to expose the iliotibial band. This was split the length the incision to expose the lateral aspect of the proximal femur at the level of the inferior most part of the greater  trochanter. Under fluoroscopic guidance, a guide wire was drilled up through the femoral neck along the calcar into the femoral head to rest within 5 mm of subchondral bone. Its position was assessed fluoroscopically in AP and lateral projections and found to be excellent. Two additional guide wires were placed in parallel fashion more proximally, anterior and posterior to the original pin in an inverted triangular configuration. Again the position of these pins was verified fluoroscopically in AP, lateral, and oblique projections and found to be excellent. The length of each of these pins was measured before each pin was overdrilled with the appropriate cannulated drill. Each of these screws was inserted and advanced to within 8-10 mm of subchondral bone. Again the position of each of these screws was assessed fluoroscopically in AP, lateral, and oblique projections, and found to be excellent.  One screw was removed and removed with a screw of slightly longer length to optimize its position.  The wound was copiously irrigated with sterile saline solution before the IT band was reapproximated using #0 Vicryl interrupted sutures. The subcutaneous tissues also were closed using 2-0 Vicryl interrupted sutures before the skin was closed using staples. A total of 20 cc of 0.5% Sensorcaine with epinephrine was injected in and around the surgical incision before a sterile occlusive dressing was applied to the wound. The patient was then awakened, transferred back to his hospital bed, and returned to the recovery room in satisfactory condition after tolerating the procedure well.

## 2018-12-05 NOTE — Transfer of Care (Signed)
Immediate Anesthesia Transfer of Care Note  Patient: Amber Holland  Procedure(s) Performed: CANNULATED HIP PINNING (Left )  Patient Location: PACU  Anesthesia Type:Spinal  Level of Consciousness: awake and patient cooperative  Airway & Oxygen Therapy: Patient Spontanous Breathing and Patient connected to face mask oxygen  Post-op Assessment: Report given to RN and Post -op Vital signs reviewed and stable  Post vital signs: Reviewed and stable  Last Vitals:  Vitals Value Taken Time  BP 133/60 12/05/2018  2:26 PM  Temp    Pulse 50 12/05/2018  2:29 PM  Resp 16 12/05/2018  2:29 PM  SpO2 100 % 12/05/2018  2:29 PM  Vitals shown include unvalidated device data.  Last Pain:  Vitals:   12/05/18 1219  TempSrc: Temporal  PainSc: 0-No pain      Patients Stated Pain Goal: 2 (12/04/18 2026)  Complications: No apparent anesthesia complications

## 2018-12-05 NOTE — Clinical Social Work Placement (Signed)
   CLINICAL SOCIAL WORK PLACEMENT  NOTE  Date:  12/05/2018  Patient Details  Name: Amber Holland MRN: [id number] Date of Birth: November 15, 1920  Clinical Social Work is seeking post-discharge placement for this patient at the Skilled  Nursing Facility level of care (*CSW will initial, date and re-position this form in  chart as items are completed):  Yes   Patient/family provided with [facility] Clinical Social Work Department's list of facilities offering this level of care within the geographic area requested by the patient (or if unable, by the patient's family).  Yes   Patient/family informed of their freedom to choose among providers that offer the needed level of care, that participate in Medicare, Medicaid or managed care program needed by the patient, have an available bed and are willing to accept the patient.  Yes   Patient/family informed of [facility]'s ownership interest in [facility] and [facility], as well as of the fact that they are under no obligation to receive care at these facilities.  PASRR submitted to EDS on 12/05/18     PASRR number received on 12/05/18     Existing PASRR number confirmed on       FL2 transmitted to all facilities in geographic area requested by pt/family on 12/05/18     FL2 transmitted to all facilities within larger geographic area on       Patient informed that his/her managed care company has contracts with or will negotiate with certain facilities, including the following:            Patient/family informed of bed offers received.  Patient chooses bed at       Physician recommends and patient chooses bed at      Patient to be transferred to   on  .  Patient to be transferred to facility by       Patient family notified on   of transfer.  Name of family member notified:        PHYSICIAN       Additional Comment:    _______________________________________________ Amber Holland, Amber Crocker, LCSW 12/05/2018, 12:23 PM

## 2018-12-05 NOTE — Progress Notes (Signed)
PHARMACIST - PHYSICIAN COMMUNICATION  CONCERNING:  Enoxaparin (Lovenox) for DVT Prophylaxis    RECOMMENDATION: Patient was prescribed enoxaprin 40mg  q24 hours for VTE prophylaxis.   Filed Weights   12/04/18 1551 12/05/18 0349  Weight: 110 lb (49.9 kg) 109 lb 9.1 oz (49.7 kg)    Body mass index is 22.9 kg/m.  Estimated Creatinine Clearance: 28.8 mL/min (by C-G formula based on SCr of 0.64 mg/dL).  Patient is candidate for enoxaparin 30mg  every 24 hours based on CrCl <5ml/min   DESCRIPTION: Pharmacy has adjusted enoxaparin dose per [facility] policy.   Patient is now receiving enoxaparin 30mg  every 24 hours.  Ronnald Ramp, PharmD, BCPS Clinical Pharmacist  12/05/2018 3:47 PM

## 2018-12-05 NOTE — Anesthesia Procedure Notes (Signed)
Spinal  Patient location during procedure: OR Start time: 12/05/2018 1:03 PM Staffing Anesthesiologist: Emmie Niemann, MD Resident/CRNA: Aline Brochure, CRNA Performed: anesthesiologist  Preanesthetic Checklist Completed: patient identified, site marked, surgical consent, pre-op evaluation, timeout performed, IV checked, risks and benefits discussed and monitors and equipment checked Spinal Block Patient position: sitting Prep: ChloraPrep Patient monitoring: heart rate, continuous pulse ox, blood pressure and cardiac monitor Approach: midline Location: L4-5 Injection technique: single-shot Needle Needle type: Introducer, Pencil-Tip and Whitacre  Needle gauge: 24 G Needle length: 9 cm Additional Notes Negative paresthesia. Negative blood return. Positive free-flowing CSF. Expiration date of kit checked and confirmed. Patient tolerated procedure well, without complications.

## 2018-12-05 NOTE — Plan of Care (Signed)
  Problem: Education: Goal: Verbalization of understanding the information provided (i.e., activity precautions, restrictions, etc) will improve 12/05/2018 0509 by Riot Waterworth, Genelle Gather, RN Outcome: Progressing 12/05/2018 0349 by Roxana Hires, RN Outcome: Progressing Goal: Individualized Educational Video(s) 12/05/2018 0509 by Roxana Hires, RN Outcome: Progressing 12/05/2018 0349 by Roxana Hires, RN Outcome: Progressing   Problem: Activity: Goal: Ability to ambulate and perform ADLs will improve 12/05/2018 0509 by Keauna Brasel, Genelle Gather, RN Outcome: Progressing 12/05/2018 0349 by Roxana Hires, RN Outcome: Progressing   Problem: Clinical Measurements: Goal: Postoperative complications will be avoided or minimized 12/05/2018 0509 by Loran Auguste, Genelle Gather, RN Outcome: Progressing 12/05/2018 0349 by Roxana Hires, RN Outcome: Progressing   Problem: Self-Concept: Goal: Ability to maintain and perform role responsibilities to the fullest extent possible will improve 12/05/2018 0509 by Natividad Schlosser, Genelle Gather, RN Outcome: Progressing 12/05/2018 0349 by Roxana Hires, RN Outcome: Progressing   Problem: Pain Management: Goal: Pain level will decrease 12/05/2018 0509 by Roxana Hires, RN Outcome: Progressing 12/05/2018 0349 by Roxana Hires, RN Outcome: Progressing

## 2018-12-05 NOTE — Progress Notes (Signed)
Pt returned to unit from PACU. Pt is A&Ox2, honeycomb dressing to left hip CDI. Pt can flex both thighs. Purewick reapplied. Pt's family at bedside.

## 2018-12-05 NOTE — Progress Notes (Signed)
Clinical Social Worker (CSW) contacted patient's daughter Darl PikesSusan and presented bed offers. [facility]. Daughter is okay with a semi-private room. [facility].   Baker Hughes IncorporatedBailey Chas Axel, LCSW [phone]

## 2018-12-05 NOTE — Plan of Care (Signed)

## 2018-12-05 NOTE — Progress Notes (Signed)
[employer] - [place] [facility]   PATIENT NAME: Amber Holland    MR#:  [id number]  DATE OF BIRTH:  Feb 11, 1920  SUBJECTIVE:  CHIEF COMPLAINT:   Chief Complaint  Patient presents with  . Fall  . Hip Pain   Came after an accidental fall and hip fracture.  Pain in hip but no new complaints. REVIEW OF SYSTEMS:  CONSTITUTIONAL: No fever, fatigue or weakness.  EYES: No blurred or double vision.  EARS, NOSE, AND THROAT: No tinnitus or ear pain.  RESPIRATORY: No cough, shortness of breath, wheezing or hemoptysis.  CARDIOVASCULAR: No chest pain, orthopnea, edema.  GASTROINTESTINAL: No nausea, vomiting, diarrhea or abdominal pain.  GENITOURINARY: No dysuria, hematuria.  ENDOCRINE: No polyuria, nocturia,  HEMATOLOGY: No anemia, easy bruising or bleeding SKIN: No rash or lesion. MUSCULOSKELETAL: No joint pain or arthritis.   NEUROLOGIC: No tingling, numbness, weakness.  PSYCHIATRY: No anxiety or depression.   ROS  DRUG ALLERGIES:   Allergies  Allergen Reactions  . Gabapentin Other (See Comments)    Pt states that she felt weak, trembling and lightheaded    VITALS:  Blood pressure (!) 125/97, pulse 60, temperature 100.3 F (37.9 C), resp. rate 19, height 4\' 10"  (1.473 m), weight 49.7 kg, SpO2 91 %.  PHYSICAL EXAMINATION:  GENERAL:  85 y.o.-year-old patient lying in the bed with no acute distress.  EYES: Pupils equal, round, reactive to light and accommodation. No scleral icterus. Extraocular muscles intact.  HEENT: Head atraumatic, normocephalic. Oropharynx and nasopharynx clear.  NECK:  Supple, no jugular venous distention. No thyroid enlargement, no tenderness.  LUNGS: Normal breath sounds bilaterally, no wheezing, rales,rhonchi or crepitation. No use of accessory muscles of respiration.  CARDIOVASCULAR: S1, S2 normal. No murmurs, rubs, or gallops.  ABDOMEN: Soft, nontender, nondistended. Bowel sounds present. No organomegaly or mass.  EXTREMITIES: No  pedal edema, cyanosis, or clubbing.  Pain in left hip. NEUROLOGIC: Cranial nerves II through XII are intact. Muscle strength 3/5 in all extremities. Sensation intact. Gait not checked.  PSYCHIATRIC: The patient is alert and oriented x 3.  SKIN: No obvious rash, lesion, or ulcer.   Physical Exam LABORATORY PANEL:   CBC Recent Labs  Lab 12/05/18 0245  WBC 6.5  HGB 10.6*  HCT 33.2*  PLT 259   ------------------------------------------------------------------------------------------------------------------  Chemistries  Recent Labs  Lab 12/04/18 1742  NA 137  K 3.1*  CL 101  CO2 28  GLUCOSE 147*  BUN 16  CREATININE 0.64  CALCIUM 8.8*   ------------------------------------------------------------------------------------------------------------------  Cardiac Enzymes No results for input(s): TROPONINI in the last 168 hours. ------------------------------------------------------------------------------------------------------------------  RADIOLOGY:  Dg Hip Operative Unilat W Or W/o Pelvis Left  Result Date: 12/05/2018 CLINICAL DATA:  Left hip pinning. EXAM: OPERATIVE LEFT HIP TECHNIQUE: Fluoroscopic spot image(s) were submitted for interpretation post-operatively. Fluoroscopy time was 46 seconds. COMPARISON:  Left hip x-rays from yesterday. FINDINGS: AP and lateral intraoperative fluoroscopic images demonstrate interval placement of three partially cannulated screws within the left femoral head and neck. Unchanged nondisplaced subcapital femoral neck fracture. No dislocation. IMPRESSION: 1. Intraoperative fluoroscopic guidance for left subcapital femoral neck fracture pinning. Electronically Signed   By: Obie DredgeWilliam T Derry M.D.   On: 12/05/2018 14:28   Dg Hip Unilat W Or Wo Pelvis 2-3 Views Left  Result Date: 12/04/2018 CLINICAL DATA:  Left hip pain due to an injury suffered in a fall today. Initial encounter. EXAM: DG HIP (WITH OR WITHOUT PELVIS) 2-3V LEFT COMPARISON:  None.  FINDINGS: The patient has a mildly  impacted subcapital fracture of the left hip. No other acute bony or joint abnormality is identified. Postoperative change of L4 vertebral augmentation noted. IMPRESSION: Acute subcapital fracture left hip. Electronically Signed   By: Drusilla Kannerhomas  Dalessio M.D.   On: 12/04/2018 17:37    ASSESSMENT AND PLAN:   Active Problems:   Closed left hip fracture [facility](HCC)  85 year old female with essential hypertension, chronic A. fib [facility], left hip pain now has left hip fracture.  1/ left hip fracture, initial, closed,  Plan for surgery today.  Manage per orthopedic team.  2 chronic A. fib, rate controlled on Rythmol, on aspirin alone, not on warfarin due to fall, history of previous hematoma in 2011.  Continue to monitor during surgery. 3.  Mild Alzheimer's dementia, patient very alert, awake, able to communicate well with family members and also to me. 4.  Hypokalemia Will replace and recheck tomorrow.   All the records are reviewed and case discussed with Care Management/Social Workerr. Management plans discussed with the patient, family and they are in agreement.  CODE STATUS: DNR  TOTAL TIME TAKING CARE OF THIS PATIENT: 35 minutes.     POSSIBLE D/C IN 1-2 DAYS, DEPENDING ON CLINICAL CONDITION.   Altamese DillingVaibhavkumar Waymon Laser M.D on 12/05/2018   Between 7am to 6pm - Pager - [phone]  After 6pm go to [email or web address] - password [email or web address]  [facility] [place] [facility]  Office  [phone]  CC: Primary care physician; Lynnea FerrierKlein, Bert J III, MD  Note: This dictation was prepared with Dragon dictation along with smaller phrase technology. Any transcriptional errors that result from this process are unintentional.

## 2018-12-05 NOTE — [place] FL2 (Signed)
Reading MEDICAID FL2 LEVEL OF CARE SCREENING TOOL     IDENTIFICATION  Patient Name: Amber Holland Birthdate: [birth year] Sex: female Admission Date (Current Location): 12/04/2018  [place]   Facility and Address:  [facility], [address], [place], [place] [address]      Provider Number: [id number]  Attending Physician Name and Address:  Altamese DillingVachhani, Vaibhavkumar, *  Relative Name and Phone Number:       Current Level of Care: Hospital Recommended Level of Care: Skilled Nursing Facility Prior Approval Number:    Date Approved/Denied:   PASRR Number: ([id number])  Discharge Plan: SNF    Current Diagnoses: Patient Active Problem List   Diagnosis Date Noted  . Closed left hip fracture (HCC) 12/04/2018  . Osteoarthritis of knee (Left) 04/03/2018  . Chronic knee pain (Primary Area of Pain) (Left) 03/31/2018  . Chronic pain syndrome 03/31/2018  . Pharmacologic therapy 03/31/2018  . Disorder of skeletal system 03/31/2018  . Problems influencing health status 03/31/2018  . Vaginal pessary in situ 03/16/2016  . Urinary incontinence 08/27/2015  . Cystocele, midline 08/27/2015  . Bilateral carpal tunnel syndrome 01/30/2015  . B12 deficiency 12/25/2014  . Hereditary and idiopathic neuropathy 12/25/2014  . Peripheral polyneuropathy 12/25/2014  . Atrial fibrillation (HCC) 05/09/2014  . Benign essential hypertension 05/09/2014    Orientation RESPIRATION BLADDER Height & Weight     Self, Time, Situation  Normal Continent Weight: 109 lb 9.1 oz (49.7 kg) Height:  4\' 10"  (147.3 cm)  BEHAVIORAL SYMPTOMS/MOOD NEUROLOGICAL BOWEL NUTRITION STATUS      Continent Diet(Diet: NPO for surgery to be advanced. )  AMBULATORY STATUS COMMUNICATION OF NEEDS Skin   Extensive Assist Verbally Surgical wounds                       Personal Care Assistance Level of Assistance  Bathing, Feeding, Dressing Bathing Assistance:  Limited assistance Feeding assistance: Independent Dressing Assistance: Limited assistance     Functional Limitations Info  Sight, Hearing, Speech Sight Info: Adequate Hearing Info: Impaired Speech Info: Adequate    SPECIAL CARE FACTORS FREQUENCY  PT (By licensed PT), OT (By licensed OT)     PT Frequency: (5) OT Frequency: (5)            Contractures      Additional Factors Info  Code Status, Allergies Code Status Info: (DNR ) Allergies Info: (Gabapentin)           Current Medications (12/05/2018):  This is the current hospital active medication list Current Facility-Administered Medications  Medication Dose Route Frequency Provider Last Rate Last Dose  . 0.9 %  sodium chloride infusion   Intravenous Continuous Katha HammingKonidena, Snehalatha, MD 75 mL/hr at 12/04/18 2048    . [MAR Hold] acetaminophen (TYLENOL) tablet 650 mg  650 mg Oral Q6H PRN Katha HammingKonidena, Snehalatha, MD   650 mg at 12/05/18 0948   Or  . [MAR Hold] acetaminophen (TYLENOL) suppository 650 mg  650 mg Rectal Q6H PRN Katha HammingKonidena, Snehalatha, MD      . Mitzi Hansen[MAR Hold] amLODipine (NORVASC) tablet 2.5 mg  2.5 mg Oral Daily Katha HammingKonidena, Snehalatha, MD   2.5 mg at 12/05/18 0947  . [MAR Hold] aspirin EC tablet 81 mg  81 mg Oral Daily Katha HammingKonidena, Snehalatha, MD      . Mitzi Hansen[MAR Hold] bisacodyl (DULCOLAX) EC tablet 5 mg  5 mg Oral Daily PRN Katha HammingKonidena, Snehalatha, MD      . Mitzi Hansen[MAR Hold] busPIRone (BUSPAR)  tablet 15 mg  15 mg Oral BID Katha Hamming, MD      . ceFAZolin (ANCEF) IVPB 2g/100 mL premix  2 g Intravenous 30 min Pre-Op Poggi, Excell Seltzer, MD      . Mitzi Hansen Hold] celecoxib (CELEBREX) capsule 200 mg  200 mg Oral Daily Katha Hamming, MD      . Mitzi Hansen Hold] Chlorhexidine Gluconate Cloth 2 % PADS 6 each  6 each Topical Daily Poggi, Excell Seltzer, MD   6 each at 12/05/18 1143  . [MAR Hold] darifenacin (ENABLEX) 24 hr tablet 7.5 mg  7.5 mg Oral Daily Katha Hamming, MD      . Mitzi Hansen Hold] docusate sodium (COLACE) capsule 100 mg  100 mg Oral Daily  PRN Katha Hamming, MD      . Mitzi Hansen Hold] docusate sodium (COLACE) capsule 100 mg  100 mg Oral BID Katha Hamming, MD      . Mitzi Hansen Hold] HYDROmorphone (DILAUDID) injection 1 mg  1 mg Intravenous Q4H PRN Katha Hamming, MD      . Mitzi Hansen Hold] irbesartan (AVAPRO) tablet 37.5 mg  37.5 mg Oral Daily Katha Hamming, MD      . Mitzi Hansen Hold] mupirocin ointment (BACTROBAN) 2 % 1 application  1 application Nasal BID Poggi, Excell Seltzer, MD   1 application at 12/05/18 1143  . [MAR Hold] ondansetron (ZOFRAN) tablet 4 mg  4 mg Oral Q6H PRN Katha Hamming, MD       Or  . Mitzi Hansen Hold] ondansetron (ZOFRAN) injection 4 mg  4 mg Intravenous Q6H PRN Katha Hamming, MD      . Mitzi Hansen Hold] pantoprazole (PROTONIX) EC tablet 40 mg  40 mg Oral Daily Katha Hamming, MD      . Mitzi Hansen Hold] polyethylene glycol (MIRALAX / GLYCOLAX) packet 17 g  17 g Oral Daily Katha Hamming, MD      . Mitzi Hansen Hold] propafenone (RYTHMOL) tablet 225 mg  225 mg Oral BID Katha Hamming, MD   225 mg at 12/05/18 0947  . [MAR Hold] raloxifene (EVISTA) tablet 60 mg  60 mg Oral Daily Katha Hamming, MD      . Mitzi Hansen Hold] sertraline (ZOLOFT) tablet 50 mg  50 mg Oral Nightly Katha Hamming, MD   50 mg at 12/04/18 2026  . [MAR Hold] traZODone (DESYREL) tablet 25 mg  25 mg Oral QHS PRN Katha Hamming, MD      . Mitzi Hansen Hold] vitamin B-12 (CYANOCOBALAMIN) tablet 1,000 mcg  1,000 mcg Oral Daily Katha Hamming, MD         Discharge Medications: Please see discharge summary for a list of discharge medications.  Relevant Imaging Results:  Relevant Lab Results:   Additional Information (SSN: [id number])  Keldrick Pomplun, Darleen Crocker, LCSW

## 2018-12-05 NOTE — Clinical Social Work Note (Signed)
Clinical Social Work Assessment  Patient Details  Name: Amber Holland MRN: [id number] Date of Birth: 11-18-1920  Date of referral:  12/05/18               Reason for consult:  Facility Placement                Permission sought to share information with:  Chartered certified accountant granted to share information::  Yes, Verbal Permission Granted  Name::      Amber Holland::   [facility] [place]   Relationship::     Contact Information:     Housing/Transportation Living arrangements for the past 2 months:  [place] of Information:  Patient, Adult Children Patient Interpreter Needed:  None Criminal Activity/Legal Involvement Pertinent to Current Situation/Hospitalization:  No - Comment as needed Significant Relationships:  Adult Children Lives with:  Facility Resident Do you feel safe going back to the place where you live?  Yes Need for family participation in patient care:  Yes (Comment)  Care giving concerns:  Patient is a resident at [facility].    Social Worker assessment / plan:  Holiday representative (Young Harris) reviewed chart and noted that patient has a hip fracture. Patient will have surgery today per RN. PT is pending. CSW met with patient and her daughter Amber Holland [phone] was at bedside. CSW introduced self and explained role of CSW department. Per daughter she is patient's HPOA and lives in [place] with her husband. Per Amber Holland patient has 1 adult son in [place]. Per daughter patient lives at [facility] and has never been to SNF before. CSW explained that after surgery PT will evaluate patient and make a recommendation of home health or SNF. CSW explained that patient will likely need to go to SNF. CSW explained that medicare requires a 3 night qualifying inpatient stay in a hospital in order to pay for SNF. Patient was admitted to inpatient 12/04/2018. Daughter verbalized her understanding and is agreeable to SNF  search in [place]. Daughter prefers [facility]. Per daughter she is starting radiation herself on Wednesday. CSW provided emotional support. FL2 complete and faxed out. CSW will continue to follow and assist as needed.   Employment status:  Disabled (Comment on whether or not currently receiving Disability), Retired Forensic scientist:  Medicare PT Recommendations:  Not assessed at this time Information / Referral to community resources:  [place]  Patient/Family's Response to care:  Patient's daughter is agreeable to [facility] in [place].   Patient/Family's Understanding of and Emotional Response to Diagnosis, Current Treatment, and Prognosis:  Patient's daughter was very pleasant and thanked CSW for assistance.   Emotional Assessment Appearance:  Appears stated age Attitude/Demeanor/Rapport:    Affect (typically observed):  Accepting, Adaptable, Pleasant Orientation:  Oriented to Self, Oriented to  Time, Fluctuating Orientation (Suspected and/or reported Sundowners) Alcohol / Substance use:  Not Applicable Psych involvement (Current and /or in the community):  No (Comment)  Discharge Needs  Concerns to be addressed:  Discharge Planning Concerns Readmission within the last 30 days:  No Current discharge risk:  Dependent with Mobility Barriers to Discharge:  Continued Medical Work up   [employer], Veronia Beets, LCSW 12/05/2018, 12:24 PM

## 2018-12-05 NOTE — Anesthesia Post-op Follow-up Note (Signed)
Anesthesia QCDR form completed.        

## 2018-12-06 ENCOUNTER — Encounter: Payer: Self-pay | Admitting: Surgery

## 2018-12-06 LAB — CBC
HCT: 34.9 % — ABNORMAL LOW (ref 36.0–46.0)
Hemoglobin: 11.1 g/dL — ABNORMAL LOW (ref 12.0–15.0)
MCH: 27.7 pg (ref 26.0–34.0)
MCHC: 31.8 g/dL (ref 30.0–36.0)
MCV: 87 fL (ref 80.0–100.0)
Platelets: 265 10*3/uL (ref 150–400)
RBC: 4.01 MIL/uL (ref 3.87–5.11)
RDW: 15.8 % — AB (ref 11.5–15.5)
WBC: 8.9 10*3/uL (ref 4.0–10.5)
nRBC: 0 % (ref 0.0–0.2)

## 2018-12-06 LAB — BASIC METABOLIC PANEL
Anion gap: 8 (ref 5–15)
BUN: 10 mg/dL (ref 8–23)
CO2: 26 mmol/L (ref 22–32)
Calcium: 7.9 mg/dL — ABNORMAL LOW (ref 8.9–10.3)
Chloride: 103 mmol/L (ref 98–111)
Creatinine, Ser: 0.51 mg/dL (ref 0.44–1.00)
GFR calc non Af Amer: >60 mL/min (ref >60)
Glucose, Bld: 118 mg/dL — ABNORMAL HIGH (ref 70–99)
Potassium: 2.9 mmol/L — ABNORMAL LOW (ref 3.5–5.1)
Sodium: 137 mmol/L (ref 135–145)

## 2018-12-06 LAB — MAGNESIUM: Magnesium: 1.8 mg/dL (ref 1.7–2.4)

## 2018-12-06 LAB — GLUCOSE, CAPILLARY: Glucose-Capillary: 107 mg/dL — ABNORMAL HIGH (ref 70–99)

## 2018-12-06 MED ORDER — CALCIUM CARBONATE ANTACID 500 MG PO CHEW
2.5000 | CHEWABLE_TABLET | Freq: Two times a day (BID) | ORAL | Status: DC
  Administered 2018-12-07: 500 mg via ORAL
  Filled 2018-12-06: qty 3

## 2018-12-06 MED ORDER — POTASSIUM CHLORIDE CRYS ER 20 MEQ PO TBCR
40.0000 meq | EXTENDED_RELEASE_TABLET | Freq: Two times a day (BID) | ORAL | Status: DC
  Administered 2018-12-07 (×2): 40 meq via ORAL
  Filled 2018-12-06 (×2): qty 2

## 2018-12-06 MED ORDER — CALCIUM CARBONATE 1250 (500 CA) MG PO TABS
1.0000 | ORAL_TABLET | Freq: Two times a day (BID) | ORAL | Status: DC
  Filled 2018-12-06: qty 1

## 2018-12-06 MED ORDER — AMLODIPINE BESYLATE 10 MG PO TABS
10.0000 mg | ORAL_TABLET | Freq: Every day | ORAL | Status: DC
  Administered 2018-12-07: 10 mg via ORAL

## 2018-12-06 NOTE — Anesthesia Postprocedure Evaluation (Signed)
Anesthesia Post Note  Patient: Amber Holland  Procedure(s) Performed: CANNULATED HIP PINNING (Left )  Patient location during evaluation: Nursing Unit Anesthesia Type: Spinal Level of consciousness: awake, awake and alert, oriented and confused Pain management: pain level controlled Vital Signs Assessment: post-procedure vital signs reviewed and stable Respiratory status: spontaneous breathing, nonlabored ventilation and respiratory function stable Cardiovascular status: stable Postop Assessment: adequate PO intake, no apparent nausea or vomiting, patient able to bend at knees, no headache and no backache Anesthetic complications: no     Last Vitals:  Vitals:   12/05/18 2347 12/06/18 0300  BP: (!) 154/90 (!) 186/70  Pulse: 72 66  Resp: 16 18  Temp: 36.7 C 36.7 C  SpO2: 94% 94%    Last Pain:  Vitals:   12/06/18 0300  TempSrc: Oral  PainSc:                  Lyn Records

## 2018-12-06 NOTE — Evaluation (Signed)
Physical Therapy Evaluation Patient Details Name: Amber Holland MRN: [id number] DOB: 21-Sep-1920 Today's Date: 12/06/2018   History of Present Illness  85 y.o. female with a known history of central hypertension, chronic A. fib, GERD [facility], with left hip pain, found to have a left hip fracture.  s/p pinning 12/05/18.  Clinical Impression  Pt very pleasant t/o PT eval, though she is somewhat confused and needed a lot of redirection and explanation t/o the session.  She was able to do ~15 minutes of exercises (AAROM only on the L) and showed good effort but was pain limited and weak and most notedly struggled with standing tasks/theraputic activities and was unable to maintain WBing status w/o excessive assist from PT.  Pt will need STR once medically ready for d/c.     Follow Up Recommendations SNF    Equipment Recommendations  None recommended by PT    Recommendations for Other Services       Precautions / Restrictions Precautions Precautions: Fall Restrictions Weight Bearing Restrictions: Yes LLE Weight Bearing: Touchdown weight bearing      Mobility  Bed Mobility Overal bed mobility: Needs Assistance Bed Mobility: Supine to Sit     Supine to sit: Mod assist     General bed mobility comments: Pt showed some effort in getting to EOB, but ultimately needed a lot of assist to attain sitting  Transfers Overall transfer level: Needs assistance Equipment used: Rolling walker (2 wheeled) Transfers: Sit to/from Stand Sit to Stand: Mod assist;Max assist         General transfer comment: Pt showed some hesitancy, but was willing to try.  Ultimately did need considerable assist to attain standing  Ambulation/Gait             General Gait Details: Pt unable to truly do any ambulation, managed 1 small steps with L, but needed max assist to maintain TTWBing status and was essentially a dependent pivot transfer to the  recliner  Stairs            Wheelchair Mobility    Modified Rankin (Stroke Patients Only)       Balance                                             Pertinent Vitals/Pain Pain Assessment: 0-10 Pain Score: 3  Pain Location: L hip pain increases significantly with any activity    Home Living Family/patient expects to be discharged to:: Skilled nursing facility                      Prior Function Level of Independence: Independent with assistive device(s)         Comments: Per pt report she is able to walk ad lib, stay active, goes to church, able to participate in "extra-curriculars"     Hand Dominance        Extremity/Trunk Assessment   Upper Extremity Assessment Upper Extremity Assessment: Generalized weakness(age appropriate limitations)    Lower Extremity Assessment Lower Extremity Assessment: Generalized weakness(L hip with only minimal AROM, AAROM for most, no WBing)       Communication   Communication: No difficulties  Cognition Arousal/Alertness: Awake/alert Behavior During Therapy: Anxious Overall Cognitive Status: History of cognitive impairments - at baseline  General Comments      Exercises General Exercises - Lower Extremity Ankle Circles/Pumps: AROM;AAROM;10 reps Quad Sets: AAROM;10 reps Short Arc Quad: AAROM;AROM;10 reps Heel Slides: AAROM;10 reps Hip ABduction/ADduction: AROM;AAROM;10 reps   Assessment/Plan    PT Assessment Patient needs continued PT services  PT Problem List Decreased strength;Decreased range of motion;Decreased activity tolerance;Decreased balance;Decreased mobility;Decreased coordination;Decreased safety awareness;Decreased knowledge of use of DME;Pain       PT Treatment Interventions DME instruction;Gait training;Stair training;Functional mobility training;Therapeutic activities;Therapeutic exercise;Balance  training;Patient/family education;Neuromuscular re-education;Cognitive remediation    PT Goals (Current goals can be found in the Care Plan section)  Acute Rehab PT Goals Patient Stated Goal: Get back to walking PT Goal Formulation: With patient Time For Goal Achievement: 12/20/18 Potential to Achieve Goals: Fair    Frequency Min 2X/week   Barriers to discharge        Co-evaluation               AM-PAC PT "6 Clicks" Mobility  Outcome Measure Help needed turning from your back to your side while in a flat bed without using bedrails?: A Lot Help needed moving from lying on your back to sitting on the side of a flat bed without using bedrails?: A Lot Help needed moving to and from a bed to a chair (including a wheelchair)?: Total Help needed standing up from a chair using your arms (e.g., wheelchair or bedside chair)?: Total Help needed to walk in hospital room?: Total Help needed climbing 3-5 steps with a railing? : Total 6 Click Score: 8    End of Session Equipment Utilized During Treatment: Gait belt Activity Tolerance: Patient tolerated treatment well Patient left: with call bell/phone within reach Nurse Communication: Mobility status PT Visit Diagnosis: Unsteadiness on feet (R26.81) Pain - Right/Left: Left Pain - part of body: Hip    Time: 6004-5997 PT Time Calculation (min) (ACUTE ONLY): 40 min   Charges:   PT Evaluation $PT Eval Low Complexity: 1 Low PT Treatments $Therapeutic Exercise: 8-22 mins $Therapeutic Activity: 8-22 mins        Malachi Pro, DPT 12/06/2018, 10:15 AM

## 2018-12-06 NOTE — Progress Notes (Signed)
Physical Therapy Treatment Patient Details Name: Amber Holland MRN: [id number] DOB: 01-15-1920 Today's Date: 12/06/2018    History of Present Illness 85 y.o. female with a known history of central hypertension, chronic A. fib, GERD brought in from [facility] because of fall, with left hip pain, found to have a left hip fracture.  s/p pinning 12/05/18.    PT Comments    Pt showed good effort with exercises and was able to follow along with cuing and instructions well and did make improvements with tolerance and effort with increased reps.  Pt with pain and hesitation with L hip movement and profoundly struggles with standing and has not been able to do anything that could be called ambulation and this likely will have difficulty maintaining L TTWBing AND trying to do ambulation at this point. Pt family present and very supportive in encouraging her t/o session.   Follow Up Recommendations  SNF     Equipment Recommendations  None recommended by PT    Recommendations for Other Services       Precautions / Restrictions Precautions Precautions: Fall Restrictions LLE Weight Bearing: Touchdown weight bearing    Mobility  Bed Mobility Overal bed mobility: Needs Assistance Bed Mobility: Sit to Supine       Sit to supine: Mod assist   General bed mobility comments: Pt not able to help much with getting back into bed, generally anxious and pain guarded  Transfers Overall transfer level: Needs assistance Equipment used: Rolling walker (2 wheeled) Transfers: Sit to/from [employer] Transfers Sit to Stand: Max assist Stand pivot transfers: Max assist       General transfer comment: Pt showed good effort in trying to get to standing but simply could not shift weight forward, keep weight off L and effectively use UEs/R LE to attain standing w/o heavy +2 assist and cuing  Ambulation/Gait                 Stairs             Wheelchair Mobility     Modified Rankin (Stroke Patients Only)       Balance Overall balance assessment: Needs assistance Sitting-balance support: Bilateral upper extremity supported Sitting balance-Leahy Scale: Fair Sitting balance - Comments: Pt able to maintain sitting once positioned well, leans back and needs cuing     Standing balance-Leahy Scale: Zero Standing balance comment: Pt is unable to maintain standing and WBing status w/o total assist                            Cognition Arousal/Alertness: Awake/alert Behavior During Therapy: Anxious Overall Cognitive Status: History of cognitive impairments - at baseline                                        Exercises General Exercises - Lower Extremity Ankle Circles/Pumps: AROM;10 reps Quad Sets: AAROM;10 reps Short Arc Quad: AROM;AAROM;10 reps Heel Slides: AAROM;10 reps(resisted extension on R) Hip ABduction/ADduction: AAROM;10 reps(resisted ab/ad on R) Straight Leg Raises: AAROM;AROM(AROM on R, able to do 2 AROM reps on L with warm up)    General Comments        Pertinent Vitals/Pain Pain Assessment: Faces Faces Pain Scale: Hurts little more Pain Location: Pt not complaining of constant pain, but clearly some issues with L hip movement    Home Living  Prior Function            PT Goals (current goals can now be found in the care plan section) Progress towards PT goals: Progressing toward goals    Frequency    BID      PT Plan Current plan remains appropriate    Co-evaluation              AM-PAC PT "6 Clicks" Mobility   Outcome Measure  Help needed turning from your back to your side while in a flat bed without using bedrails?: A Lot Help needed moving from lying on your back to sitting on the side of a flat bed without using bedrails?: A Lot Help needed moving to and from a bed to a chair (including a wheelchair)?: Total Help needed standing up from a  chair using your arms (e.g., wheelchair or bedside chair)?: Total Help needed to walk in hospital room?: Total Help needed climbing 3-5 steps with a railing? : Total 6 Click Score: 8    End of Session Equipment Utilized During Treatment: Gait belt Activity Tolerance: Patient tolerated treatment well Patient left: with bed alarm set;with call bell/phone within reach;with family/visitor present   PT Visit Diagnosis: Unsteadiness on feet (R26.81);Muscle weakness (generalized) (M62.81);Difficulty in walking, not elsewhere classified (R26.2) Pain - Right/Left: Left Pain - part of body: Hip     Time: 1320-1402 PT Time Calculation (min) (ACUTE ONLY): 42 min  Charges:  $Therapeutic Exercise: 23-37 mins $Therapeutic Activity: 8-22 mins                     Malachi ProGalen R Theordore Cisnero, DPT 12/06/2018, 4:06 PM

## 2018-12-06 NOTE — Progress Notes (Signed)
Subjective: 1 Day Post-Op Procedure(s) (LRB): CANNULATED HIP PINNING (Left) Patient reports pain as mild.   Patient is well but has continued to try to get out of bed without assistance. PT and care management to assist with discharge planning. Negative for chest pain and shortness of breath Fever: no Gastrointestinal:Negative for nausea and vomiting  Objective: Vital signs in last 24 hours: Temp:  [97.4 F (36.3 C)-100.3 F (37.9 C)] 98 F (36.7 C) (01/07 0300) Pulse Rate:  [52-72] 66 (01/07 0300) Resp:  [14-19] 18 (01/07 0300) BP: (111-199)/(60-97) 186/70 (01/07 0300) SpO2:  [91 %-100 %] 94 % (01/07 0300)  Intake/Output from previous day:  Intake/Output Summary (Last 24 hours) at 12/06/2018 0730 Last data filed at 12/05/2018 2050 Gross per 24 hour  Intake 1622.39 ml  Output 1130 ml  Net 492.39 ml    Intake/Output this shift: No intake/output data recorded.  Labs: Recent Labs    12/04/18 1742 12/05/18 0245 12/06/18 0251  HGB 11.8* 10.6* 11.1*   Recent Labs    12/05/18 0245 12/06/18 0251  WBC 6.5 8.9  RBC 3.75* 4.01  HCT 33.2* 34.9*  PLT 259 265   Recent Labs    12/04/18 1742 12/06/18 0251  NA 137 137  K 3.1* 2.9*  CL 101 103  CO2 28 26  BUN 16 10  CREATININE 0.64 0.51  GLUCOSE 147* 118*  CALCIUM 8.8* 7.9*   Recent Labs    12/04/18 1742  INR 1.02     EXAM General - Patient is Alert, Appropriate and Oriented Extremity - Sensation intact distally Intact pulses distally Dorsiflexion/Plantar flexion intact Incision: dressing C/D/I No cellulitis present Dressing/Incision - clean, dry, no drainage Motor Function - intact, moving foot and toes well on exam. Patient is able to perform a straight leg raise with mild assistance.  Past Medical History:  Diagnosis Date  . AF (atrial fibrillation) (HCC)   . Arthritis   . Constipation   . Cystocele   . GERD (gastroesophageal reflux disease)   . Hypertension   . Nocturia   . Osteoporosis   .  Stroke (HCC) 04/2018   TIA  . Urethral caruncle   . Urinary incontinence   . Vaginal atrophy     Assessment/Plan: 1 Day Post-Op Procedure(s) (LRB): CANNULATED HIP PINNING (Left) Active Problems:   Closed left hip fracture (HCC)  Estimated body mass index is 22.9 kg/m as calculated from the following:   Height as of this encounter: 4\' 10"  (1.473 m).   Weight as of this encounter: 49.7 kg. Advance diet Up with therapy   Labs reviewed this morning, K+ 2.9 is currently on supplementation. Up with therapy today. Begin working on having a BM. CBC and BMP ordered for tomorrow morning.  DVT Prophylaxis - Lovenox and TED hose Toe-touch weightbearing to the left leg.  Valeria Batman, PA-C [facility] 12/06/2018, 7:30 AM

## 2018-12-07 ENCOUNTER — Encounter
Admission: RE | Admit: 2018-12-07 | Discharge: 2018-12-07 | Disposition: A | Discharge status: Home / Self Care | Payer: Medicare Other | Source: Ambulatory Visit | Attending: Internal Medicine | Admitting: Internal Medicine

## 2018-12-07 LAB — CBC
HEMATOCRIT: 36.1 % (ref 36.0–46.0)
HEMOGLOBIN: 11.5 g/dL — AB (ref 12.0–15.0)
MCH: 27.8 pg (ref 26.0–34.0)
MCHC: 31.9 g/dL (ref 30.0–36.0)
MCV: 87.2 fL (ref 80.0–100.0)
Platelets: 268 10*3/uL (ref 150–400)
RBC: 4.14 MIL/uL (ref 3.87–5.11)
RDW: 15.7 % — ABNORMAL HIGH (ref 11.5–15.5)
WBC: 8.1 10*3/uL (ref 4.0–10.5)
nRBC: 0 % (ref 0.0–0.2)

## 2018-12-07 LAB — BASIC METABOLIC PANEL
Anion gap: 8 (ref 5–15)
BUN: 11 mg/dL (ref 8–23)
CO2: 27 mmol/L (ref 22–32)
Calcium: 8.4 mg/dL — ABNORMAL LOW (ref 8.9–10.3)
Chloride: 100 mmol/L (ref 98–111)
Creatinine, Ser: 0.56 mg/dL (ref 0.44–1.00)
GFR calc Af Amer: >60 mL/min (ref >60)
Glucose, Bld: 103 mg/dL — ABNORMAL HIGH (ref 70–99)
Potassium: 3.2 mmol/L — ABNORMAL LOW (ref 3.5–5.1)
Sodium: 135 mmol/L (ref 135–145)

## 2018-12-07 LAB — GLUCOSE, CAPILLARY: Glucose-Capillary: 92 mg/dL (ref 70–99)

## 2018-12-07 MED ORDER — TRAMADOL HCL 50 MG PO TABS: 50.0000 mg | ORAL_TABLET | Freq: Two times a day (BID) | ORAL | 0 refills | Status: DC | PRN

## 2018-12-07 MED ORDER — AMLODIPINE BESYLATE 10 MG PO TABS: 10.0000 mg | ORAL_TABLET | Freq: Every day | ORAL | 0 refills | Status: DC

## 2018-12-07 MED ORDER — ENOXAPARIN SODIUM 30 MG/0.3ML ~~LOC~~ SOLN: 30.0000 mg | SUBCUTANEOUS | 0 refills | Status: DC

## 2018-12-07 NOTE — Care Management Important Message (Signed)
Important Message  Patient Details  Name: Amber Holland MRN: [id number] Date of Birth: 07-13-1920   Medicare Important Message Given:  Yes    Olegario Messier A Eliazar Olivar 12/07/2018, 10:46 AM

## 2018-12-07 NOTE — Discharge Summary (Addendum)
[facility] [place] at [facility]   PATIENT NAME: Amber Holland    MR#:  [id number]  DATE OF BIRTH:  1920/01/08  DATE OF ADMISSION:  12/04/2018 ADMITTING PHYSICIAN: Katha Hamming, MD  DATE OF DISCHARGE: 12/07/2018   PRIMARY CARE PHYSICIAN: Curtis Sites III, MD    ADMISSION DIAGNOSIS:  Closed subcapital fracture of left femur, initial encounter (HCC) [S72.012A]  DISCHARGE DIAGNOSIS:  Active Problems:   Closed left hip fracture (HCC)   SECONDARY DIAGNOSIS:   Past Medical History:  Diagnosis Date  . AF (atrial fibrillation) (HCC)   . Arthritis   . Constipation   . Cystocele   . GERD (gastroesophageal reflux disease)   . Hypertension   . Nocturia   . Osteoporosis   . Stroke (HCC) 04/2018   TIA  . Urethral caruncle   . Urinary incontinence   . Vaginal atrophy     HOSPITAL COURSE:   85 year old female with essential hypertension, chronic A. fib brought in from [facility] because of fall, left hip pain now has left hip fracture.  1/ left hip fracture, initial, closed,  S/p CANNULATED HIP PINNING (Left), Manage per orthopedic team.   Lovenox inj for 14 days. Pt does not have much pain.  Follow ortho clinic in 14 days. 2 chronic A. fib, rate controlled on Rythmol, on aspirin alone, not on warfarin due to fall, history of previous hematoma in 2011. Continue to monitor during surgery.  3. Mild Alzheimer's dementia, patient very alert, awake, able to communicate well with family members and also to me.  4. Hypokalemia  Will replace and recheck tomorrow. Normal.   DISCHARGE CONDITIONS:   Stable.  CONSULTS OBTAINED:  Treatment Team:  Christena Flake, MD  DRUG ALLERGIES:   Allergies  Allergen Reactions  . Gabapentin Other (See Comments)    Pt states that she felt weak, trembling and lightheaded    DISCHARGE MEDICATIONS:   Allergies as of 12/07/2018      Reactions   Gabapentin Other (See Comments)   Pt states that she felt  weak, trembling and lightheaded      Medication List    STOP taking these medications   celecoxib 200 MG capsule Commonly known as:  CELEBREX   metoprolol tartrate 25 MG tablet Commonly known as:  LOPRESSOR     TAKE these medications   acetaminophen 325 MG tablet Commonly known as:  TYLENOL Take 650 mg by mouth 3 (three) times daily as needed (Knee pain).   acetaminophen 650 MG CR tablet Commonly known as:  TYLENOL Take 650 mg by mouth every 8 (eight) hours as needed for pain.   amLODipine 10 MG tablet Commonly known as:  NORVASC Take 1 tablet (10 mg total) by mouth daily. Start taking on:  December 08, 2018 What changed:    medication strength  See the new instructions.   aspirin EC 81 MG tablet Take 81 mg by mouth daily.   busPIRone 15 MG tablet Commonly known as:  BUSPAR Take 15 mg by mouth 2 (two) times daily.   Camphor-Menthol-Methyl Sal 03-09-29 % Crea Apply 1 application topically 2 (two) times daily as needed.   candesartan 32 MG tablet Commonly known as:  ATACAND TAKE 1 TABLET DAILY   diclofenac sodium 1 % Gel Commonly known as:  VOLTAREN Apply 2 g topically 4 (four) times daily as needed (Knee pain).   docusate sodium 100 MG capsule Commonly known as:  COLACE Take 100 mg by mouth daily  as needed (for constipation if Miralax does not work.).   enoxaparin 30 MG/0.3ML injection Commonly known as:  LOVENOX Inject 0.3 mLs (30 mg total) into the skin daily for 14 days.   hydrOXYzine 25 MG tablet Commonly known as:  ATARAX/VISTARIL Take 25 mg by mouth 3 (three) times daily as needed for itching.   omeprazole 40 MG capsule Commonly known as:  PRILOSEC TAKE 1 CAPSULE DAILY   polyethylene glycol packet Commonly known as:  MIRALAX / GLYCOLAX Take 17 g by mouth daily.   propafenone 225 MG tablet Commonly known as:  RYTHMOL TAKE 1 TABLET BY MOUTH TWICE A DAY   raloxifene 60 MG tablet Commonly known as:  EVISTA TAKE 1 TABLET DAILY   sertraline 50  MG tablet Commonly known as:  ZOLOFT Take 50 mg by mouth Nightly.   traMADol 50 MG tablet Commonly known as:  ULTRAM Take 1 tablet (50 mg total) by mouth every 12 (twelve) hours as needed for moderate pain or severe pain.   trospium 20 MG tablet Commonly known as:  SANCTURA TAKE 1 TABLET TWICE A DAY   vitamin B-12 1000 MCG tablet Commonly known as:  CYANOCOBALAMIN Take 1,000 mcg by mouth daily.        DISCHARGE INSTRUCTIONS:   Follow with ortho clinic in 1-2 weeks.  If you experience worsening of your admission symptoms, develop shortness of breath, life threatening emergency, suicidal or homicidal thoughts you must seek medical attention immediately by calling 911 or calling your MD immediately  if symptoms less severe.  You Must read complete instructions/literature along with all the possible adverse reactions/side effects for all the Medicines you take and that have been prescribed to you. Take any new Medicines after you have completely understood and accept all the possible adverse reactions/side effects.   Please note  You were cared for by a hospitalist during your hospital stay. If you have any questions about your discharge medications or the care you received while you were in the hospital after you are discharged, you can call the unit and asked to speak with the hospitalist on call if the hospitalist that took care of you is not available. Once you are discharged, your primary care physician will handle any further medical issues. Please note that NO REFILLS for any discharge medications will be authorized once you are discharged, as it is imperative that you return to your primary care physician (or establish a relationship with a primary care physician if you do not have one) for your aftercare needs so that they can reassess your need for medications and monitor your lab values.    Today   CHIEF COMPLAINT:   Chief Complaint  Patient presents with  . Fall  .  Hip Pain    HISTORY OF PRESENT ILLNESS:  Amber Holland  is a 85 y.o. female with a known history of central hypertension, chronic A. fib, GERD brought in from [facility] because of fall, noticed left hip pain, found to have a left hip fracture.  Patient fell off side of the bed and noted left hip pain.  Denies any chest pain, dizziness.  Has left hip pain, unable to put weight on the left leg.  VITAL SIGNS:  Blood pressure (!) 163/63, pulse 71, temperature 97.7 F (36.5 C), temperature source Oral, resp. rate 16, height 4\' 10"  (1.473 m), weight 49.7 kg, SpO2 93 %.  I/O:    Intake/Output Summary (Last 24 hours) at 12/07/2018 1258 Last data filed at  12/07/2018 0900 Gross per 24 hour  Intake 840 ml  Output 450 ml  Net 390 ml    PHYSICAL EXAMINATION:   GENERAL:  85 y.o.-year-old patient lying in the bed with no acute distress.  EYES: Pupils equal, round, reactive to light and accommodation. No scleral icterus. Extraocular muscles intact.  HEENT: Head atraumatic, normocephalic. Oropharynx and nasopharynx clear.  NECK:  Supple, no jugular venous distention. No thyroid enlargement, no tenderness.  LUNGS: Normal breath sounds bilaterally, no wheezing, rales,rhonchi or crepitation. No use of accessory muscles of respiration.  CARDIOVASCULAR: S1, S2 normal. No murmurs, rubs, or gallops.  ABDOMEN: Soft, nontender, nondistended. Bowel sounds present. No organomegaly or mass.  EXTREMITIES: No pedal edema, cyanosis, or clubbing.  Pain in left hip. NEUROLOGIC: Cranial nerves II through XII are intact. Muscle strength 3/5 in all extremities. Sensation intact. Gait not checked.  PSYCHIATRIC: The patient is alert and oriented x 3.  SKIN: No obvious rash, lesion, or ulcer.   DATA REVIEW:   CBC Recent Labs  Lab 12/07/18 0512  WBC 8.1  HGB 11.5*  HCT 36.1  PLT 268    Chemistries  Recent Labs  Lab 12/06/18 0251 12/07/18 0512  NA 137 135  K 2.9* 3.2*  CL 103 100  CO2 26 27   GLUCOSE 118* 103*  BUN 10 11  CREATININE 0.51 0.56  CALCIUM 7.9* 8.4*  MG 1.8  --     Cardiac Enzymes No results for input(s): TROPONINI in the last 168 hours.  Microbiology Results  Results for orders placed or performed during the hospital encounter of 12/04/18  Surgical pcr screen     Status: Abnormal   Collection Time: 12/04/18  3:47 PM  Result Value Ref Range Status   MRSA, PCR NEGATIVE NEGATIVE Final   Staphylococcus aureus POSITIVE (A) NEGATIVE Final    Comment: (NOTE) The Xpert SA Assay (FDA approved for NASAL specimens in patients 83 years of age and older), is one component of a comprehensive surveillance program. It is not intended to diagnose infection nor to guide or monitor treatment. Performed at [facility], [address] [place], [place] [address]     RADIOLOGY:  Dg Hip Operative Unilat W Or W/o Pelvis Left  Result Date: 12/05/2018 CLINICAL DATA:  Left hip pinning. EXAM: OPERATIVE LEFT HIP TECHNIQUE: Fluoroscopic spot image(s) were submitted for interpretation post-operatively. Fluoroscopy time was 46 seconds. COMPARISON:  Left hip x-rays from yesterday. FINDINGS: AP and lateral intraoperative fluoroscopic images demonstrate interval placement of three partially cannulated screws within the left femoral head and neck. Unchanged nondisplaced subcapital femoral neck fracture. No dislocation. IMPRESSION: 1. Intraoperative fluoroscopic guidance for left subcapital femoral neck fracture pinning. Electronically Signed   By: Obie DredgeWilliam T Derry M.D.   On: 12/05/2018 14:28    EKG:   Orders placed or performed during the hospital encounter of 12/04/18  . ED EKG  . ED EKG  . EKG 12-Lead  . EKG 12-Lead      Management plans discussed with the patient, family and they are in agreement.  CODE STATUS:     Code Status Orders  (From admission, onward)         Start     Ordered   12/04/18 1846  Do not attempt resuscitation (DNR)  Continuous     Question Answer Comment  In the event of cardiac or respiratory ARREST Do not call a "code blue"   In the event of cardiac or respiratory ARREST Do not perform Intubation, CPR,  defibrillation or ACLS   In the event of cardiac or respiratory ARREST Use medication by any route, position, wound care, and other measures to relive pain and suffering. May use oxygen, suction and manual treatment of airway obstruction as needed for comfort.   Comments NMP      12/04/18 1848        Code Status History    This patient has a current code status but no historical code status.    Advance Directive Documentation     Most Recent Value  Type of Advance Directive  Healthcare Power of Attorney, Out of facility DNR (pink MOST or yellow form)  Pre-existing out of facility DNR order (yellow form or pink MOST form)  Yellow form placed in chart (order not valid for inpatient use)  "MOST" Form in Place?  -      TOTAL TIME TAKING CARE OF THIS PATIENT: 35 minutes.    Altamese DillingVaibhavkumar Ugochukwu Chichester M.D on 12/07/2018 at 12:58 PM  Between 7am to 6pm - Pager - [phone]  After 6pm go to [email or web address] - password [email or web address]  [facility]  Office  [phone]  CC: Primary care physician; Lynnea FerrierKlein, Bert J III, MD   Note: This dictation was prepared with Dragon dictation along with smaller phrase technology. Any transcriptional errors that result from this process are unintentional.

## 2018-12-07 NOTE — Progress Notes (Signed)
Subjective: 2 Days Post-Op Procedure(s) (LRB): CANNULATED HIP PINNING (Left) Patient reports pain as mild.   Patient is well, reports that she slept extremely well last night. Plan for discharge to SNF when medically appropriate. Negative for chest pain and shortness of breath Fever: no Gastrointestinal:Negative for nausea and vomiting  Objective: Vital signs in last 24 hours: Temp:  [97.5 F (36.4 C)-98.4 F (36.9 C)] 98.4 F (36.9 C) (01/07 2325) Pulse Rate:  [62-65] 63 (01/07 2325) Resp:  [16-19] 19 (01/07 2325) BP: (172-186)/(56-68) 172/60 (01/07 2325) SpO2:  [92 %-94 %] 94 % (01/07 2325)  Intake/Output from previous day:  Intake/Output Summary (Last 24 hours) at 12/07/2018 0750 Last data filed at 12/07/2018 0514 Gross per 24 hour  Intake 720 ml  Output 450 ml  Net 270 ml    Intake/Output this shift: No intake/output data recorded.  Labs: Recent Labs    12/04/18 1742 12/05/18 0245 12/06/18 0251 12/07/18 0512  HGB 11.8* 10.6* 11.1* 11.5*   Recent Labs    12/06/18 0251 12/07/18 0512  WBC 8.9 8.1  RBC 4.01 4.14  HCT 34.9* 36.1  PLT 265 268   Recent Labs    12/06/18 0251 12/07/18 0512  NA 137 135  K 2.9* 3.2*  CL 103 100  CO2 26 27  BUN 10 11  CREATININE 0.51 0.56  GLUCOSE 118* 103*  CALCIUM 7.9* 8.4*   Recent Labs    12/04/18 1742  INR 1.02     EXAM General - Patient is Alert, Appropriate and Oriented Extremity - Sensation intact distally Intact pulses distally Dorsiflexion/Plantar flexion intact Incision: dressing C/D/I No cellulitis present Dressing/Incision - clean, dry, no drainage Motor Function - intact, moving foot and toes well on exam. Patient is able to perform a straight leg raise with mild assistance.  Past Medical History:  Diagnosis Date  . AF (atrial fibrillation) (HCC)   . Arthritis   . Constipation   . Cystocele   . GERD (gastroesophageal reflux disease)   . Hypertension   . Nocturia   . Osteoporosis   . Stroke  (HCC) 04/2018   TIA  . Urethral caruncle   . Urinary incontinence   . Vaginal atrophy     Assessment/Plan: 2 Days Post-Op Procedure(s) (LRB): CANNULATED HIP PINNING (Left) Active Problems:   Closed left hip fracture (HCC)  Estimated body mass index is 22.9 kg/m as calculated from the following:   Height as of this encounter: 4\' 10"  (1.473 m).   Weight as of this encounter: 49.7 kg. Advance diet Up with therapy   Labs reviewed this morning, K+ 3.2 is currently on supplementation. Up with therapy today. Patient has had a BM Plan for discharge to SNF when medically appropriate.  Upon discharge continue Lovenox 30mg  daily for 14 days. Continue with toe-touch weightbearing to the left leg. Follow-up with [facility] in 2 weeks for staple removal and repeat x-rays.  DVT Prophylaxis - Lovenox and TED hose Toe-touch weightbearing to the left leg.  Valeria Batman, PA-C [facility] 12/07/2018, 7:50 AM

## 2018-12-07 NOTE — Progress Notes (Signed)
Physical Therapy Treatment Patient Details Name: Amber Holland MRN: [id number] DOB: January 27, 1920 Today's Date: 12/07/2018    History of Present Illness 85 y.o. female with a known history of central hypertension, chronic A. fib, GERD brought in from [facility] because of fall, with left hip pain, found to have a left hip fracture.  s/p pinning 12/05/18.    PT Comments    Pt presents with deficits in strength, transfers, mobility, gait, balance, and activity tolerance.  Pt pleasant throughout the session and willing to participate with all below therex and mobility tasks.  Pt guarded during the session but stated that pain was not as bad as she was expecting.  Pt required extensive assistance with bed mobility tasks and transfers but showed good effort.  Pt required an elevated surface and extensive assist to stand but she required only min A to remain in standing around 30 sec.  Pt was able to maintain TTWB with her L foot on top of this PT's foot to ensure compliance until the 30 sec mark at which time she began to increase WB and was returned to sitting.  Pt will benefit from PT services in a SNF setting upon discharge to safely address above deficits for decreased caregiver assistance and eventual return to PLOF.     Follow Up Recommendations  SNF     Equipment Recommendations  None recommended by PT    Recommendations for Other Services       Precautions / Restrictions Precautions Precautions: Fall Restrictions Weight Bearing Restrictions: Yes LLE Weight Bearing: Touchdown weight bearing    Mobility  Bed Mobility Overal bed mobility: Needs Assistance Bed Mobility: Sit to Supine     Supine to sit: Max assist;+2 for physical assistance Sit to supine: Max assist;+2 for physical assistance   General bed mobility comments: Pt guarded during bed mobility tasks with little assistance provided but after both sup to sit and sit to sup patient stated "that wasn't too  bad"  Transfers Overall transfer level: Needs assistance Equipment used: Rolling walker (2 wheeled) Transfers: Sit to/from Stand Sit to Stand: Max assist;From elevated surface;+2 physical assistance         General transfer comment: Pt motivated to stand and although she required an elevated surface and extensive assist to stand she required only min A to remain in standing around 30 sec.  Pt was able to maintain TTWB with her L foot on top of this PT's foot until the 30 sec mark at which time she began to increase WB and was returned to sitting.    Ambulation/Gait             General Gait Details: Unable/unsafe to attempt with pt very unlikely to maintain WB status with amb attempt   Stairs             Wheelchair Mobility    Modified Rankin (Stroke Patients Only)       Balance Overall balance assessment: Needs assistance Sitting-balance support: Bilateral upper extremity supported Sitting balance-Leahy Scale: Fair     Standing balance support: Bilateral upper extremity supported Standing balance-Leahy Scale: Poor Standing balance comment: Min A in standing to prevent posterior LOB                            Cognition Arousal/Alertness: Awake/alert Behavior During Therapy: WFL for tasks assessed/performed Overall Cognitive Status: Within Functional Limits for tasks assessed  Exercises Total Joint Exercises Ankle Circles/Pumps: AROM;Both;10 reps Quad Sets: AROM;Strengthening;Both;10 reps Heel Slides: AAROM;Both;5 reps Hip ABduction/ADduction: AROM;AAROM;Both;10 reps Straight Leg Raises: AROM;AAROM;Both;10 reps Long Arc Quad: AROM;Both;10 reps Knee Flexion: AROM;Both;10 reps Other Exercises Other Exercises: Log roll training to the R with a pillow between the knees for pain control    General Comments        Pertinent Vitals/Pain Pain Assessment: 0-10 Pain Score: 6  Pain Location:  L hip Pain Descriptors / Indicators: Aching;Sore Pain Intervention(s): Monitored during session    Home Living                      Prior Function            PT Goals (current goals can now be found in the care plan section)      Frequency    BID      PT Plan Current plan remains appropriate    Co-evaluation              AM-PAC PT "6 Clicks" Mobility   Outcome Measure  Help needed turning from your back to your side while in a flat bed without using bedrails?: A Lot Help needed moving from lying on your back to sitting on the side of a flat bed without using bedrails?: A Lot Help needed moving to and from a bed to a chair (including a wheelchair)?: Total Help needed standing up from a chair using your arms (e.g., wheelchair or bedside chair)?: A Lot Help needed to walk in hospital room?: Total Help needed climbing 3-5 steps with a railing? : Total 6 Click Score: 9    End of Session Equipment Utilized During Treatment: Gait belt Activity Tolerance: Patient tolerated treatment well;No increased pain Patient left: with bed alarm set;with call bell/phone within reach;with family/visitor present;in bed Nurse Communication: Mobility status PT Visit Diagnosis: Unsteadiness on feet (R26.81);Muscle weakness (generalized) (M62.81);Difficulty in walking, not elsewhere classified (R26.2) Pain - Right/Left: Left Pain - part of body: Hip     Time: 0923-3007 PT Time Calculation (min) (ACUTE ONLY): 26 min  Charges:  $Therapeutic Exercise: 8-22 mins $Therapeutic Activity: 8-22 mins                     D. Scott Nishant Schrecengost PT, DPT 12/07/18, 11:00 AM

## 2018-12-07 NOTE — Progress Notes (Signed)
Patient is medically stable for D/C to [facility] today. Per [facility] admissions coordinator at [facility] patient can come today to room 210-A. RN will call report at [phone] and arrange EMS for transport. Clinical Child psychotherapist (CSW) sent D/C orders to [facility] [employer] via [employer]. CSW contacted patient's daughter Darl Pikes and made her aware of above. Please reconsult if future social work needs arise. CSW signing off.   [employer], LCSW [phone]

## 2018-12-07 NOTE — Progress Notes (Signed)
[facility] [place] [facility]   PATIENT NAME: Amber BongMargaret Holland    MR#:  [id number]  DATE OF BIRTH:  03/17/1920  SUBJECTIVE:  CHIEF COMPLAINT:   Chief Complaint  Patient presents with  . Fall  . Hip Pain   Came after an accidental fall and hip fracture.  Pain in hip but no new complaints.  s/p surgery. No complains.  REVIEW OF SYSTEMS:  CONSTITUTIONAL: No fever, fatigue or weakness.  EYES: No blurred or double vision.  EARS, NOSE, AND THROAT: No tinnitus or ear pain.  RESPIRATORY: No cough, shortness of breath, wheezing or hemoptysis.  CARDIOVASCULAR: No chest pain, orthopnea, edema.  GASTROINTESTINAL: No nausea, vomiting, diarrhea or abdominal pain.  GENITOURINARY: No dysuria, hematuria.  ENDOCRINE: No polyuria, nocturia,  HEMATOLOGY: No anemia, easy bruising or bleeding SKIN: No rash or lesion. MUSCULOSKELETAL: No joint pain or arthritis.   NEUROLOGIC: No tingling, numbness, weakness.  PSYCHIATRY: No anxiety or depression.   ROS  DRUG ALLERGIES:   Allergies  Allergen Reactions  . Gabapentin Other (See Comments)    Pt states that she felt weak, trembling and lightheaded    VITALS:  Blood pressure (!) 172/60, pulse 63, temperature 98.4 F (36.9 C), resp. rate 19, height 4\' 10"  (1.473 m), weight 49.7 kg, SpO2 94 %.  PHYSICAL EXAMINATION:  GENERAL:  85 y.o.-year-old patient lying in the bed with no acute distress.  EYES: Pupils equal, round, reactive to light and accommodation. No scleral icterus. Extraocular muscles intact.  HEENT: Head atraumatic, normocephalic. Oropharynx and nasopharynx clear.  NECK:  Supple, no jugular venous distention. No thyroid enlargement, no tenderness.  LUNGS: Normal breath sounds bilaterally, no wheezing, rales,rhonchi or crepitation. No use of accessory muscles of respiration.  CARDIOVASCULAR: S1, S2 normal. No murmurs, rubs, or gallops.  ABDOMEN: Soft, nontender, nondistended. Bowel sounds present. No  organomegaly or mass.  EXTREMITIES: No pedal edema, cyanosis, or clubbing.  Pain in left hip. NEUROLOGIC: Cranial nerves II through XII are intact. Muscle strength 3/5 in all extremities. Sensation intact. Gait not checked.  PSYCHIATRIC: The patient is alert and oriented x 3.  SKIN: No obvious rash, lesion, or ulcer.   Physical Exam LABORATORY PANEL:   CBC Recent Labs  Lab 12/07/18 0512  WBC 8.1  HGB 11.5*  HCT 36.1  PLT 268   ------------------------------------------------------------------------------------------------------------------  Chemistries  Recent Labs  Lab 12/06/18 0251 12/07/18 0512  NA 137 135  K 2.9* 3.2*  CL 103 100  CO2 26 27  GLUCOSE 118* 103*  BUN 10 11  CREATININE 0.51 0.56  CALCIUM 7.9* 8.4*  MG 1.8  --    ------------------------------------------------------------------------------------------------------------------  Cardiac Enzymes No results for input(s): TROPONINI in the last 168 hours. ------------------------------------------------------------------------------------------------------------------  RADIOLOGY:  Dg Hip Operative Unilat W Or W/o Pelvis Left  Result Date: 12/05/2018 CLINICAL DATA:  Left hip pinning. EXAM: OPERATIVE LEFT HIP TECHNIQUE: Fluoroscopic spot image(s) were submitted for interpretation post-operatively. Fluoroscopy time was 46 seconds. COMPARISON:  Left hip x-rays from yesterday. FINDINGS: AP and lateral intraoperative fluoroscopic images demonstrate interval placement of three partially cannulated screws within the left femoral head and neck. Unchanged nondisplaced subcapital femoral neck fracture. No dislocation. IMPRESSION: 1. Intraoperative fluoroscopic guidance for left subcapital femoral neck fracture pinning. Electronically Signed   By: Obie DredgeWilliam T Derry M.D.   On: 12/05/2018 14:28    ASSESSMENT AND PLAN:   Active Problems:   Closed left hip fracture (HCC)  85 year old female with essential hypertension,  chronic A. fib brought in from  [facility] because of fall, left hip pain now has left hip fracture.  1/ left hip fracture, initial, closed,  S/p CANNULATED HIP PINNING (Left),  Manage per orthopedic team.  2 chronic A. fib, rate controlled on Rythmol, on aspirin alone, not on warfarin due to fall, history of previous hematoma in 2011.  Continue to monitor during surgery. 3.  Mild Alzheimer's dementia, patient very alert, awake, able to communicate well with family members and also to me. 4.  Hypokalemia Will replace and recheck tomorrow.   All the records are reviewed and case discussed with Care Management/Social Workerr. Management plans discussed with the patient, family and they are in agreement.  CODE STATUS: DNR  TOTAL TIME TAKING CARE OF THIS PATIENT: 35 minutes.    POSSIBLE D/C IN 1-2 DAYS, DEPENDING ON CLINICAL CONDITION.   Altamese Dilling M.D on 12/07/2018   Between 7am to 6pm - Pager - [phone]  After 6pm go to [email or web address] - password [email or web address]  [facility]  Office  [phone]  CC: Primary care physician; Lynnea Ferrier, MD  Note: This dictation was prepared with Dragon dictation along with smaller phrase technology. Any transcriptional errors that result from this process are unintentional.

## 2018-12-07 NOTE — Clinical Social Work Placement (Signed)
   CLINICAL SOCIAL WORK PLACEMENT  NOTE  Date:  12/07/2018  Patient Details  Name: Amber Holland MRN: [id number] Date of Birth: 08-May-1920  Clinical Social Work is seeking post-discharge placement for this patient at the Skilled  Nursing Facility level of care (*CSW will initial, date and re-position this form in  chart as items are completed):  Yes   Patient/family provided with [facility] Clinical Social Work Department's list of facilities offering this level of care within the geographic area requested by the patient (or if unable, by the patient's family).  Yes   Patient/family informed of their freedom to choose among providers that offer the needed level of care, that participate in Medicare, Medicaid or managed care program needed by the patient, have an available bed and are willing to accept the patient.  Yes   Patient/family informed of [facility]'s ownership interest [facility] and [facility], as well as of the fact that they are under no obligation to receive care at these facilities.  PASRR submitted to EDS on 12/05/18     PASRR number received on 12/05/18     Existing PASRR number confirmed on       FL2 transmitted to all facilities in geographic area requested by pt/family on 12/05/18     FL2 transmitted to all facilities within larger geographic area on       Patient informed that his/her managed care company has contracts with or will negotiate with certain facilities, including the following:        Yes   Patient/family informed of bed offers received.  Patient chooses bed at [facility] )     Physician recommends and patient chooses bed at      Patient to be transferred to [facility] ) on 12/07/18.  Patient to be transferred to facility by [facility] )     Patient family notified on 12/07/18 of transfer.  Name of family member notified:  (Patient's daughter Darl PikesSusan is aware of D/C today. )     PHYSICIAN        Additional Comment:    _______________________________________________ Peytyn Trine, Darleen CrockerBailey M, LCSW 12/07/2018, 1:58 PM

## 2018-12-08 ENCOUNTER — Non-Acute Institutional Stay (SKILLED_NURSING_FACILITY): Payer: Medicare Other | Admitting: Adult Health

## 2018-12-08 ENCOUNTER — Encounter: Payer: Self-pay | Admitting: Adult Health

## 2018-12-08 DIAGNOSIS — S72002S Fracture of unspecified part of neck of left femur, sequela: Secondary | ICD-10-CM

## 2018-12-08 DIAGNOSIS — K219 Gastro-esophageal reflux disease without esophagitis: Secondary | ICD-10-CM | POA: Diagnosis not present

## 2018-12-08 DIAGNOSIS — I10 Essential (primary) hypertension: Secondary | ICD-10-CM

## 2018-12-08 DIAGNOSIS — I4811 Longstanding persistent atrial fibrillation: Secondary | ICD-10-CM

## 2018-12-08 DIAGNOSIS — F418 Other specified anxiety disorders: Secondary | ICD-10-CM

## 2018-12-08 DIAGNOSIS — N3281 Overactive bladder: Secondary | ICD-10-CM

## 2018-12-08 DIAGNOSIS — K5909 Other constipation: Secondary | ICD-10-CM

## 2018-12-08 DIAGNOSIS — M810 Age-related osteoporosis without current pathological fracture: Secondary | ICD-10-CM

## 2018-12-08 DIAGNOSIS — E876 Hypokalemia: Secondary | ICD-10-CM

## 2018-12-08 NOTE — Progress Notes (Signed)
Location:   [facility] Room Number: 210 A Place of Service:  SNF (31)   CODE STATUS: DNR  Allergies  Allergen Reactions  . Gabapentin Other (See Comments)    Pt states that she felt weak, trembling and lightheaded    Chief Complaint  Patient presents with  . Hospitalization Follow-up    Hospital Follow up    HPI:  She is a 85 year old woman who had a fall and suffered a closed left hip fracture. She had a left hip pinning. She is here for short term rehab with her goal to return back home. She denies any uncontrolled left hip pain; no changes in appetite; denies any anxiety or depressive thoughts. She will continue to be followed for her chronic illnesses including: afib; hypertension; gerd.   Past Medical History:  Diagnosis Date  . AF (atrial fibrillation) (HCC)   . Arthritis   . Constipation   . Cystocele   . GERD (gastroesophageal reflux disease)   . Hypertension   . Nocturia   . Osteoporosis   . Stroke (HCC) 04/2018   TIA  . Urethral caruncle   . Urinary incontinence   . Vaginal atrophy     Past Surgical History:  Procedure Laterality Date  . CARPAL TUNNEL RELEASE    . HIP PINNING,CANNULATED Left 12/05/2018   Procedure: CANNULATED HIP PINNING;  Surgeon: Christena Flake, MD;  Location: [facility] ORS;  Service: Orthopedics;  Laterality: Left;  . NO PAST SURGERIES      Social History   Socioeconomic History  . Marital status: Widowed    Spouse name: Not on file  . Number of children: Not on file  . Years of education: Not on file  . Highest education level: Not on file  Occupational History  . Not on file  Social Needs  . Financial resource strain: Not on file  . Food insecurity:    Worry: Not on file    Inability: Not on file  . Transportation needs:    Medical: Not on file    Non-medical: Not on file  Tobacco Use  . Smoking status: Never Smoker  . Smokeless tobacco: Never Used  Substance and Sexual Activity  . Alcohol use:  Yes    Comment: occas  . Drug use: No  . Sexual activity: Not Currently    Birth control/protection: Post-menopausal  Lifestyle  . Physical activity:    Days per week: Not on file    Minutes per session: Not on file  . Stress: Not on file  Relationships  . Social connections:    Talks on phone: Not on file    Gets together: Not on file    Attends religious service: Not on file    Active member of club or organization: Not on file    Attends meetings of clubs or organizations: Not on file    Relationship status: Not on file  . Intimate partner violence:    Fear of current or ex partner: Not on file    Emotionally abused: Not on file    Physically abused: Not on file    Forced sexual activity: Not on file  Other Topics Concern  . Not on file  Social History Narrative  . Not on file   Family History  Problem Relation Age of Onset  . Cancer Neg Hx   . Diabetes Neg Hx   . Heart disease Neg Hx       VITAL SIGNS  BP 136/66   Pulse 81   Temp 98.4 F (36.9 C)   Resp 18   Ht 4\' 10"  (1.473 m)   Wt 103 lb 9.6 oz (47 kg)   SpO2 95%   BMI 21.65 kg/m   Outpatient Encounter Medications as of 12/08/2018  Medication Sig  . acetaminophen (TYLENOL) 325 MG tablet Take 650 mg by mouth 3 (three) times daily as needed (Knee pain).   Marland Kitchen amLODipine (NORVASC) 2.5 MG tablet Take 2.5 mg by mouth daily.  Marland Kitchen aspirin EC 81 MG tablet Take 81 mg by mouth daily.   . busPIRone (BUSPAR) 15 MG tablet Take 15 mg by mouth 2 (two) times daily.  . Camphor-Menthol-Methyl Sal 03-09-29 % CREA Apply 1 application topically 2 (two) times daily as needed.  . candesartan (ATACAND) 32 MG tablet TAKE 1 TABLET DAILY  . Cholecalciferol 100 MCG (4000 UT) CAPS Take 1 capsule by mouth daily.  . diclofenac sodium (VOLTAREN) 1 % GEL Apply 2 g topically 4 (four) times daily as needed (Knee pain).   Marland Kitchen docusate sodium (COLACE) 100 MG capsule Take 100 mg by mouth daily as needed (for constipation if Miralax does not work.).    Marland Kitchen enoxaparin (LOVENOX) 30 MG/0.3ML injection Inject 0.3 mLs (30 mg total) into the skin daily for 14 days.  . hydrOXYzine (ATARAX/VISTARIL) 25 MG tablet Take 25 mg by mouth 3 (three) times daily as needed for itching.  Marland Kitchen LORazepam (ATIVAN) 0.5 MG tablet Take 0.5 mg by mouth every 8 (eight) hours as needed for anxiety.  Marland Kitchen omeprazole (PRILOSEC) 40 MG capsule TAKE 1 CAPSULE DAILY  . polyethylene glycol (MIRALAX / GLYCOLAX) packet Take 17 g by mouth daily.  . propafenone (RYTHMOL) 225 MG tablet TAKE 1 TABLET BY MOUTH TWICE A DAY  . raloxifene (EVISTA) 60 MG tablet TAKE 1 TABLET DAILY  . sertraline (ZOLOFT) 50 MG tablet Take 50 mg by mouth Nightly.  . traMADol (ULTRAM) 50 MG tablet Take 1 tablet (50 mg total) by mouth every 12 (twelve) hours as needed for moderate pain or severe pain.  . trospium (SANCTURA) 20 MG tablet TAKE 1 TABLET TWICE A DAY  . vitamin B-12 (CYANOCOBALAMIN) 1000 MCG tablet Take 1,000 mcg by mouth daily.   . [DISCONTINUED] acetaminophen (TYLENOL) 650 MG CR tablet Take 650 mg by mouth every 8 (eight) hours as needed for pain.  . [DISCONTINUED] amLODipine (NORVASC) 10 MG tablet Take 1 tablet (10 mg total) by mouth daily. (Patient not taking: Reported on 12/08/2018)   No facility-administered encounter medications on file as of 12/08/2018.      SIGNIFICANT DIAGNOSTIC EXAMS  TODAY ;  12-04-18: left hip x-ray: Acute subcapital fracture left hip.  LABS REVIEWED TODAY:   12-04-18: wbc 10.2; hgb 11.8; hct 37.1; mcv 87.7; plt 300; glucose 147; bun 16; creat 0.64; k+ 3.1; na++ 137; ca 8.8  12-05-18: wbc 6.5; hgb 10.6; hct 33.2; mcv 88.5; plt 259;  12-06-17: wbc 8.9; hgb 11.1; hct 34.9; mcv 87.0; plt 265; glucose 118; bun 10; creat 0.51; k+ 2.9; na++ 137; ca 7.9 mag 1.8    Review of Systems  Constitutional: Negative for malaise/fatigue.  Respiratory: Negative for cough and shortness of breath.   Cardiovascular: Negative for chest pain, palpitations and leg swelling.  Gastrointestinal:  Negative for abdominal pain, constipation and heartburn.  Musculoskeletal: Positive for joint pain. Negative for back pain and myalgias.       Left hip pain is managed  Skin: Negative.   Neurological: Negative for  dizziness.  Psychiatric/Behavioral: The patient is not nervous/anxious.     Physical Exam Constitutional:      General: She is not in acute distress.    Appearance: She is well-developed. She is not diaphoretic.     Comments: frail  Neck:     Musculoskeletal: Neck supple.     Thyroid: No thyromegaly.  Cardiovascular:     Rate and Rhythm: Normal rate and regular rhythm.     Pulses: Normal pulses.     Heart sounds: Normal heart sounds.  Pulmonary:     Effort: Pulmonary effort is normal. No respiratory distress.     Breath sounds: Normal breath sounds.  Abdominal:     General: Bowel sounds are normal. There is no distension.     Palpations: Abdomen is soft.     Tenderness: There is no abdominal tenderness.  Musculoskeletal:     Right lower leg: No edema.     Left lower leg: No edema.     Comments: Is able to move all extremities Is status post left hip fracture   Lymphadenopathy:     Cervical: No cervical adenopathy.  Skin:    General: Skin is warm and dry.  Neurological:     Mental Status: She is alert. Mental status is at baseline.  Psychiatric:        Mood and Affect: Mood normal.     ASSESSMENT/ PLAN:  TODAY:   1. Long standing persistent atrial fibrillation: heart rate stable will continue asa 81 mg daily and rhythmol 225 mg twice daily for rate control   2. Benign essential hypertensin: is stable b/p 136/66: will continue norvasc 2.5 mg daily and atacard 32 mg daily   3. GERD without esophagitis: is stable will continue prilosec 40 mg daily   4. Chronic constipation: is stable will continue miralax daily and colace daily as needed  5. Over active bladder: is stable will continue sanctura 20 mg twice daily   6. Closed left hip fracture, sequela is  status post left hip pinning: is stable will continue therapy as directed; will follow up with orthopedics as indicated; will continue lovenox 30 mg daily for 14 days; will continue ultram 50 mg twice daily as needed  7. Post menopausal osteoporosis: is without change will continue evista 60 mg daily   8. Depression with anxiety: is stable will continue buspar 15 mg twice daily; zoloft 50 mg daily and has ativan 0.5 mg every 8 hours as needed  9. Hypokalemia: is worse: will give k+ 40 meq X 2 doses today and will repeat bmp       MD is aware of resident's narcotic use and is in agreement with current plan of care. We will attempt to wean resident as apropriate   Synthia Innocenteborah Green NP [facility]  Contact [phone] Monday through Friday 8am- 5pm  After hours call [phone]

## 2018-12-09 ENCOUNTER — Other Ambulatory Visit
Admission: RE | Admit: 2018-12-09 | Discharge: 2018-12-09 | Disposition: A | Discharge status: Home / Self Care | Payer: Medicare Other | Source: Skilled Nursing Facility | Attending: Adult Health | Admitting: Adult Health

## 2018-12-09 ENCOUNTER — Encounter: Payer: Medicare Other | Admitting: Obstetrics and Gynecology

## 2018-12-09 DIAGNOSIS — E876 Hypokalemia: Secondary | ICD-10-CM | POA: Diagnosis present

## 2018-12-09 DIAGNOSIS — I10 Essential (primary) hypertension: Secondary | ICD-10-CM | POA: Diagnosis not present

## 2018-12-09 LAB — BASIC METABOLIC PANEL
Anion gap: 7 (ref 5–15)
BUN: 21 mg/dL (ref 8–23)
CO2: 23 mmol/L (ref 22–32)
Calcium: 8.4 mg/dL — ABNORMAL LOW (ref 8.9–10.3)
Chloride: 101 mmol/L (ref 98–111)
Creatinine, Ser: 0.7 mg/dL (ref 0.44–1.00)
GFR calc Af Amer: >60 mL/min (ref >60)
GFR calc non Af Amer: >60 mL/min (ref >60)
GLUCOSE: 108 mg/dL — AB (ref 70–99)
Potassium: 5 mmol/L (ref 3.5–5.1)
Sodium: 131 mmol/L — ABNORMAL LOW (ref 135–145)

## 2018-12-12 ENCOUNTER — Encounter: Payer: Self-pay | Admitting: Adult Health

## 2018-12-12 DIAGNOSIS — F418 Other specified anxiety disorders: Secondary | ICD-10-CM | POA: Insufficient documentation

## 2018-12-12 DIAGNOSIS — K5909 Other constipation: Secondary | ICD-10-CM | POA: Insufficient documentation

## 2018-12-12 DIAGNOSIS — E876 Hypokalemia: Secondary | ICD-10-CM | POA: Insufficient documentation

## 2018-12-12 DIAGNOSIS — M8080XA Other osteoporosis with current pathological fracture, unspecified site, initial encounter for fracture: Secondary | ICD-10-CM | POA: Insufficient documentation

## 2018-12-12 DIAGNOSIS — K219 Gastro-esophageal reflux disease without esophagitis: Secondary | ICD-10-CM | POA: Insufficient documentation

## 2018-12-12 DIAGNOSIS — N3281 Overactive bladder: Secondary | ICD-10-CM | POA: Insufficient documentation

## 2018-12-12 DIAGNOSIS — M810 Age-related osteoporosis without current pathological fracture: Secondary | ICD-10-CM | POA: Insufficient documentation

## 2018-12-12 NOTE — Progress Notes (Signed)
Entered in error

## 2018-12-13 ENCOUNTER — Encounter: Payer: Self-pay | Admitting: Adult Health

## 2018-12-13 ENCOUNTER — Non-Acute Institutional Stay (SKILLED_NURSING_FACILITY): Payer: Medicare Other | Admitting: Adult Health

## 2018-12-13 DIAGNOSIS — F418 Other specified anxiety disorders: Secondary | ICD-10-CM

## 2018-12-13 NOTE — Progress Notes (Signed)
[facility]:   [facility] Room Number: 210 A Place of Service:  SNF (31)   CODE STATUS: DNR  Allergies  Allergen Reactions  . Gabapentin Other (See Comments)    Pt states that she felt weak, trembling and lightheaded    Chief Complaint  Patient presents with  . Acute Visit    Restlessness and increased confusion    HPI:  Staff reports that she is more anxious; and restless. The staff has noted that she is more confused than normal. There are no reports of changes in appetite. There are no reports of insomnia.   Past Medical History:  Diagnosis Date  . AF (atrial fibrillation) (HCC)   . Arthritis   . Constipation   . Cystocele   . GERD (gastroesophageal reflux disease)   . Hypertension   . Nocturia   . Osteoporosis   . Stroke (HCC) 04/2018   TIA  . Urethral caruncle   . Urinary incontinence   . Vaginal atrophy     Past Surgical History:  Procedure Laterality Date  . CARPAL TUNNEL RELEASE    . HIP PINNING,CANNULATED Left 12/05/2018   Procedure: CANNULATED HIP PINNING;  Surgeon: Christena Flake, MD;  Location: [facility] ORS;  Service: Orthopedics;  Laterality: Left;  . NO PAST SURGERIES      Social History   Socioeconomic History  . Marital status: Widowed    Spouse name: Not on file  . Number of children: Not on file  . Years of education: Not on file  . Highest education level: Not on file  Occupational History  . Not on file  Social Needs  . Financial resource strain: Not on file  . Food insecurity:    Worry: Not on file    Inability: Not on file  . Transportation needs:    Medical: Not on file    Non-medical: Not on file  Tobacco Use  . Smoking status: Never Smoker  . Smokeless tobacco: Never Used  Substance and Sexual Activity  . Alcohol use: Yes    Comment: occas  . Drug use: No  . Sexual activity: Not Currently    Birth control/protection: Post-menopausal  Lifestyle  . Physical activity:    Days per week: Not on file    Minutes per session: Not on file  . Stress: Not on file  Relationships  . Social connections:    Talks on phone: Not on file    Gets together: Not on file    Attends religious service: Not on file    Active member of club or organization: Not on file    Attends meetings of clubs or organizations: Not on file    Relationship status: Not on file  . Intimate partner violence:    Fear of current or ex partner: Not on file    Emotionally abused: Not on file    Physically abused: Not on file    Forced sexual activity: Not on file  Other Topics Concern  . Not on file  Social History Narrative  . Not on file   Family History  Problem Relation Age of Onset  . Cancer Neg Hx   . Diabetes Neg Hx   . Heart disease Neg Hx       VITAL SIGNS BP (!) 147/64   Pulse 69   Temp 98.1 F (36.7 C)   Resp (!) 22   Ht 4\' 10"  (1.473 m)   Wt 103 lb 14.4 oz (47.1 kg)  SpO2 93%   BMI 21.72 kg/m   Outpatient Encounter Medications as of 12/13/2018  Medication Sig  . acetaminophen (TYLENOL) 325 MG tablet Take 650 mg by mouth 3 (three) times daily as needed (Knee pain).   Marland Kitchen. amLODipine (NORVASC) 2.5 MG tablet Take 2.5 mg by mouth daily.  Marland Kitchen. aspirin EC 81 MG tablet Take 81 mg by mouth daily.   . busPIRone (BUSPAR) 15 MG tablet Take 15 mg by mouth 2 (two) times daily.  . Camphor-Menthol-Methyl Sal 03-09-29 % CREA Apply 1 application topically 2 (two) times daily as needed.  . candesartan (ATACAND) 32 MG tablet TAKE 1 TABLET DAILY  . Cholecalciferol 100 MCG (4000 UT) CAPS Take 1 capsule by mouth daily.  . diclofenac sodium (VOLTAREN) 1 % GEL Apply 2 g topically 4 (four) times daily as needed (Knee pain).   Marland Kitchen. docusate sodium (COLACE) 100 MG capsule Take 100 mg by mouth daily as needed (for constipation if Miralax does not work.).  Marland Kitchen. enoxaparin (LOVENOX) 30 MG/0.3ML injection Inject 0.3 mLs (30 mg total) into the skin daily for 14 days.  . hydrOXYzine (ATARAX/VISTARIL) 25 MG tablet Take 25 mg by mouth 3  (three) times daily as needed for itching.  Marland Kitchen. LORazepam (ATIVAN) 0.5 MG tablet Take 0.5 mg by mouth every 8 (eight) hours as needed for anxiety.  . NON FORMULARY Diet Type:  Regular  . omeprazole (PRILOSEC) 40 MG capsule TAKE 1 CAPSULE DAILY  . polyethylene glycol (MIRALAX / GLYCOLAX) packet Take 17 g by mouth daily.  . propafenone (RYTHMOL) 225 MG tablet TAKE 1 TABLET BY MOUTH TWICE A DAY  . raloxifene (EVISTA) 60 MG tablet TAKE 1 TABLET DAILY  . sertraline (ZOLOFT) 50 MG tablet Take 50 mg by mouth Nightly.  . traMADol (ULTRAM) 50 MG tablet Take 1 tablet (50 mg total) by mouth every 12 (twelve) hours as needed for moderate pain or severe pain.  . trospium (SANCTURA) 20 MG tablet TAKE 1 TABLET TWICE A DAY  . vitamin B-12 (CYANOCOBALAMIN) 1000 MCG tablet Take 1,000 mcg by mouth daily.    No facility-administered encounter medications on file as of 12/13/2018.      SIGNIFICANT DIAGNOSTIC EXAMS  PREVIOUS ;  12-04-18: left hip x-ray: Acute subcapital fracture left hip.  NO NEW EXAMS.   LABS REVIEWED PREVIOUS:   12-04-18: wbc 10.2; hgb 11.8; hct 37.1; mcv 87.7; plt 300; glucose 147; bun 16; creat 0.64; k+ 3.1; na++ 137; ca 8.8  12-05-18: wbc 6.5; hgb 10.6; hct 33.2; mcv 88.5; plt 259;  12-06-17: wbc 8.9; hgb 11.1; hct 34.9; mcv 87.0; plt 265; glucose 118; bun 10; creat 0.51; k+ 2.9; na++ 137; ca 7.9 mag 1.8   NO NEW LABS.    Review of Systems  Constitutional: Negative for malaise/fatigue.  Respiratory: Negative for cough and shortness of breath.   Cardiovascular: Negative for chest pain, palpitations and leg swelling.  Gastrointestinal: Negative for abdominal pain, constipation and heartburn.  Musculoskeletal: Negative for back pain, joint pain and myalgias.  Skin: Negative.   Neurological: Negative for dizziness.  Psychiatric/Behavioral: The patient is not nervous/anxious.    Physical Exam Constitutional:      General: She is not in acute distress.    Appearance: She is  well-developed. She is not diaphoretic.     Comments: Frail   Neck:     Musculoskeletal: Neck supple.     Thyroid: No thyromegaly.  Cardiovascular:     Rate and Rhythm: Normal rate and regular rhythm.  Pulses: Normal pulses.     Heart sounds: Normal heart sounds.  Pulmonary:     Effort: Pulmonary effort is normal. No respiratory distress.     Breath sounds: Normal breath sounds.  Abdominal:     General: Bowel sounds are normal. There is no distension.     Palpations: Abdomen is soft.     Tenderness: There is no abdominal tenderness.  Musculoskeletal:     Right lower leg: No edema.     Left lower leg: No edema.     Comments: Is able to move all extremities Is status post left hip fracture    Lymphadenopathy:     Cervical: No cervical adenopathy.  Skin:    General: Skin is warm and dry.  Neurological:     Mental Status: She is alert. Mental status is at baseline.  Psychiatric:     Comments: She is easily anxious        ASSESSMENT/ PLAN:  TODAY:   1.  Depression with anxiety: is worse will continue buspar 15 mg twice daily; will increase zoloft to 100 mg daily     MD is aware of resident's narcotic use and is in agreement with current plan of care. We will attempt to wean resident as apropriate   Amber Innocenteborah Isac Lincks NP [facility]  Contact [phone] Monday through Friday 8am- 5pm  After hours call [phone]

## 2018-12-15 ENCOUNTER — Encounter: Payer: Medicare Other | Admitting: Obstetrics and Gynecology

## 2018-12-15 ENCOUNTER — Encounter: Payer: Self-pay | Admitting: Adult Health

## 2018-12-15 ENCOUNTER — Non-Acute Institutional Stay (SKILLED_NURSING_FACILITY): Payer: Medicare Other | Admitting: Adult Health

## 2018-12-15 DIAGNOSIS — K219 Gastro-esophageal reflux disease without esophagitis: Secondary | ICD-10-CM | POA: Diagnosis not present

## 2018-12-15 DIAGNOSIS — I4811 Longstanding persistent atrial fibrillation: Secondary | ICD-10-CM

## 2018-12-15 DIAGNOSIS — I10 Essential (primary) hypertension: Secondary | ICD-10-CM

## 2018-12-15 DIAGNOSIS — K5909 Other constipation: Secondary | ICD-10-CM

## 2018-12-15 NOTE — Progress Notes (Signed)
[facility]   [facility] Room Number: 210 A Place of Service:  SNF (31)   CODE STATUS: DNR  Allergies  Allergen Reactions  . Gabapentin Other (See Comments)    Pt states that she felt weak, trembling and lightheaded    Chief Complaint  Patient presents with  . Medical Management of Chronic Issues    Longstanding persistent atrial fibrillation; benign essential hypertension; gerd without esophagitis; chronic constipation. Weekly follow up for the first 30 days post hospitalization. Care plan meeting.     HPI:  She is a 85 year old short term rehab patient being seen for the management of her chronic illnesses: afib; hypertension; gerd constipation. She denies any problems with constipation or heart burn. She denies any uncontrolled pain.  She is still requiring max assist with bed mobility; she requires 2 for transfers; for her sitting balance she requires minimal assist for sitting balance. She is able to propel her wheelchair short distances with assistance. Her family is concerned about her ativan use. Will change her as needed dosing to one time daily for 2 weeks. More than likely she will require long term placement.   Past Medical History:  Diagnosis Date  . AF (atrial fibrillation) (HCC)   . Arthritis   . Constipation   . Cystocele   . GERD (gastroesophageal reflux disease)   . Hypertension   . Nocturia   . Osteoporosis   . Stroke (HCC) 04/2018   TIA  . Urethral caruncle   . Urinary incontinence   . Vaginal atrophy     Past Surgical History:  Procedure Laterality Date  . CARPAL TUNNEL RELEASE    . HIP PINNING,CANNULATED Left 12/05/2018   Procedure: CANNULATED HIP PINNING;  Surgeon: Christena FlakePoggi, John J, MD;  Location: [facility] ORS;  Service: Orthopedics;  Laterality: Left;  . NO PAST SURGERIES      Social History   Socioeconomic History  . Marital status: Widowed    Spouse name: Not on file  . Number of children: Not on file  . Years of  education: Not on file  . Highest education level: Not on file  Occupational History  . Not on file  Social Needs  . Financial resource strain: Not on file  . Food insecurity:    Worry: Not on file    Inability: Not on file  . Transportation needs:    Medical: Not on file    Non-medical: Not on file  Tobacco Use  . Smoking status: Never Smoker  . Smokeless tobacco: Never Used  Substance and Sexual Activity  . Alcohol use: Yes    Comment: occas  . Drug use: No  . Sexual activity: Not Currently    Birth control/protection: Post-menopausal  Lifestyle  . Physical activity:    Days per week: Not on file    Minutes per session: Not on file  . Stress: Not on file  Relationships  . Social connections:    Talks on phone: Not on file    Gets together: Not on file    Attends religious service: Not on file    Active member of club or organization: Not on file    Attends meetings of clubs or organizations: Not on file    Relationship status: Not on file  . Intimate partner violence:    Fear of current or ex partner: Not on file    Emotionally abused: Not on file    Physically abused: Not on file  Forced sexual activity: Not on file  Other Topics Concern  . Not on file  Social History Narrative  . Not on file   Family History  Problem Relation Age of Onset  . Cancer Neg Hx   . Diabetes Neg Hx   . Heart disease Neg Hx       VITAL SIGNS BP (!) 137/59   Pulse 74   Temp 97.9 F (36.6 C)   Resp (!) 21   Ht 4\' 10"  (1.473 m)   Wt 103 lb 14.4 oz (47.1 kg)   SpO2 95%   BMI 21.72 kg/m   Outpatient Encounter Medications as of 12/15/2018  Medication Sig  . acetaminophen (TYLENOL) 325 MG tablet Take 650 mg by mouth 3 (three) times daily as needed (Knee pain).   Marland Kitchen amLODipine (NORVASC) 2.5 MG tablet Take 2.5 mg by mouth daily.  Marland Kitchen aspirin EC 81 MG tablet Take 81 mg by mouth daily.   . busPIRone (BUSPAR) 15 MG tablet Take 15 mg by mouth 2 (two) times daily.  .  Camphor-Menthol-Methyl Sal 03-09-29 % CREA Apply 1 application topically 2 (two) times daily as needed.  . candesartan (ATACAND) 32 MG tablet TAKE 1 TABLET DAILY  . Cholecalciferol 100 MCG (4000 UT) CAPS Take 1 capsule by mouth daily.  . diclofenac sodium (VOLTAREN) 1 % GEL Apply 2 g topically 4 (four) times daily as needed (Knee pain).   Marland Kitchen docusate sodium (COLACE) 100 MG capsule Take 100 mg by mouth daily as needed (for constipation if Miralax does not work.).  Marland Kitchen enoxaparin (LOVENOX) 30 MG/0.3ML injection Inject 0.3 mLs (30 mg total) into the skin daily for 14 days.  . hydrOXYzine (ATARAX/VISTARIL) 25 MG tablet Take 25 mg by mouth 3 (three) times daily as needed for itching.  Marland Kitchen LORazepam (ATIVAN) 0.5 MG tablet Take 0.5 mg by mouth every 8 (eight) hours as needed for anxiety.  . NON FORMULARY Diet Type:  Regular  . omeprazole (PRILOSEC) 40 MG capsule TAKE 1 CAPSULE DAILY  . polyethylene glycol (MIRALAX / GLYCOLAX) packet Take 17 g by mouth daily.  . propafenone (RYTHMOL) 225 MG tablet TAKE 1 TABLET BY MOUTH TWICE A DAY  . raloxifene (EVISTA) 60 MG tablet TAKE 1 TABLET DAILY  . sertraline (ZOLOFT) 100 MG tablet Take 100 mg by mouth at bedtime.   . traMADol (ULTRAM) 50 MG tablet Take 1 tablet (50 mg total) by mouth every 12 (twelve) hours as needed for moderate pain or severe pain.  . trospium (SANCTURA) 20 MG tablet TAKE 1 TABLET TWICE A DAY  . vitamin B-12 (CYANOCOBALAMIN) 1000 MCG tablet Take 1,000 mcg by mouth daily.    No facility-administered encounter medications on file as of 12/15/2018.      SIGNIFICANT DIAGNOSTIC EXAMS   PREVIOUS ;  12-04-18: left hip x-ray: Acute subcapital fracture left hip.  NO NEW EXAMS.   LABS REVIEWED PREVIOUS:   12-04-18: wbc 10.2; hgb 11.8; hct 37.1; mcv 87.7; plt 300; glucose 147; bun 16; creat 0.64; k+ 3.1; na++ 137; ca 8.8  12-05-18: wbc 6.5; hgb 10.6; hct 33.2; mcv 88.5; plt 259;  12-06-17: wbc 8.9; hgb 11.1; hct 34.9; mcv 87.0; plt 265; glucose 118; bun  10; creat 0.51; k+ 2.9; na++ 137; ca 7.9 mag 1.8   TODAY:   12-07-18: wbc 8.1 hgb 11.5; hct 36.1; mcv 87.2; plt 268; glucose 103;bun 11; creat 0.56; k+ 3.2; na++ 135; ca 8.4  12-09-18: glucose 108; bun 21; creat 0.70; k+ 5.0; na++ 131;  ca 8.4    Review of Systems  Constitutional: Negative for malaise/fatigue.  Respiratory: Negative for cough and shortness of breath.   Cardiovascular: Negative for chest pain, palpitations and leg swelling.  Gastrointestinal: Negative for abdominal pain, constipation and heartburn.  Musculoskeletal: Negative for back pain, joint pain and myalgias.  Skin: Negative.   Neurological: Negative for dizziness.  Psychiatric/Behavioral: The patient is not nervous/anxious.      Physical Exam Constitutional:      General: She is not in acute distress.    Appearance: She is well-developed. She is not diaphoretic.     Comments: Frail   Neck:     Musculoskeletal: Neck supple.     Thyroid: No thyromegaly.  Cardiovascular:     Rate and Rhythm: Normal rate and regular rhythm.     Pulses: Normal pulses.     Heart sounds: Normal heart sounds.  Pulmonary:     Effort: Pulmonary effort is normal. No respiratory distress.     Breath sounds: Normal breath sounds.  Abdominal:     General: Bowel sounds are normal. There is no distension.     Palpations: Abdomen is soft.     Tenderness: There is no abdominal tenderness.  Musculoskeletal:     Right lower leg: No edema.     Left lower leg: No edema.     Comments:  Is able to move all extremities Is status post left hip fracture     Lymphadenopathy:     Cervical: No cervical adenopathy.  Skin:    General: Skin is warm and dry.  Neurological:     Mental Status: She is alert. Mental status is at baseline.  Psychiatric:        Mood and Affect: Mood normal.     ASSESSMENT/ PLAN:  TODAY:   1. Long standing persistent atrial fibrillation: heart rate stable will continue asa 81 mg daily and rhythmol 225 mg twice  daily for rate control   2. Benign essential hypertensin: is stable b/p 137/59: will continue norvasc 2.5 mg daily and atacard 32 mg daily   3. GERD without esophagitis: is stable will continue prilosec 40 mg daily   4. Chronic constipation: is stable will continue miralax daily and colace daily as needed  PREVIOUS   5. Over active bladder: is stable will continue sanctura 20 mg twice daily   6. Closed left hip fracture, sequela is status post left hip pinning: is stable will continue therapy as directed; will follow up with orthopedics as indicated; will continue lovenox 30 mg daily for 14 days; will continue ultram 50 mg twice daily as needed  7. Post menopausal osteoporosis: is without change will continue evista 60 mg daily   8. Depression with anxiety: is stable will continue buspar 15 mg twice daily; zoloft 50 mg daily and has ativan 0.5 mg every 8 hours as needed       MD is aware of resident's narcotic use and is in agreement with current plan of care. We will attempt to wean resident as apropriate   Synthia Innocenteborah Byanca Kasper NP [facility]  Contact [phone] Monday through Friday 8am- 5pm  After hours call [phone]

## 2018-12-22 ENCOUNTER — Non-Acute Institutional Stay (SKILLED_NURSING_FACILITY): Payer: Medicare Other | Admitting: Adult Health

## 2018-12-22 ENCOUNTER — Encounter: Payer: Self-pay | Admitting: Adult Health

## 2018-12-22 DIAGNOSIS — N3281 Overactive bladder: Secondary | ICD-10-CM

## 2018-12-22 DIAGNOSIS — S72002S Fracture of unspecified part of neck of left femur, sequela: Secondary | ICD-10-CM

## 2018-12-22 DIAGNOSIS — K5909 Other constipation: Secondary | ICD-10-CM | POA: Diagnosis not present

## 2018-12-22 NOTE — Progress Notes (Signed)
Location:   [facility] Room Number: 2 Place of Service:  SNF (31)   CODE STATUS: dnr  Allergies  Allergen Reactions  . Gabapentin Other (See Comments)    Pt states that she felt weak, trembling and lightheaded    Chief Complaint  Patient presents with  . Medical Management of Chronic Issues    Closed fracture of left hip sequela; chronic constipation; overactive bladder. Weekly follow up for the first 30 days post hospitalization.     HPI:  She is a 85 year old woman short term rehab patient being seen for the management of her chronic illnesses: left hip fracture; constipation; overactive bladder. She denies any constipation; there are no reports of uncontrolled pain or changes in appetite. She has not utilized her ultram will stop this medication.   Past Medical History:  Diagnosis Date  . AF (atrial fibrillation) (HCC)   . Arthritis   . Constipation   . Cystocele   . GERD (gastroesophageal reflux disease)   . Hypertension   . Nocturia   . Osteoporosis   . Stroke (HCC) 04/2018   TIA  . Urethral caruncle   . Urinary incontinence   . Vaginal atrophy     Past Surgical History:  Procedure Laterality Date  . CARPAL TUNNEL RELEASE    . HIP PINNING,CANNULATED Left 12/05/2018   Procedure: CANNULATED HIP PINNING;  Surgeon: Christena FlakePoggi, John J, MD;  Location: [facility] ORS;  Service: Orthopedics;  Laterality: Left;  . NO PAST SURGERIES      Social History   Socioeconomic History  . Marital status: Widowed    Spouse name: Not on file  . Number of children: Not on file  . Years of education: Not on file  . Highest education level: Not on file  Occupational History  . Not on file  Social Needs  . Financial resource strain: Not on file  . Food insecurity:    Worry: Not on file    Inability: Not on file  . Transportation needs:    Medical: Not on file    Non-medical: Not on file  Tobacco Use  . Smoking status: Never Smoker  . Smokeless tobacco: Never Used    Substance and Sexual Activity  . Alcohol use: Yes    Comment: occas  . Drug use: No  . Sexual activity: Not Currently    Birth control/protection: Post-menopausal  Lifestyle  . Physical activity:    Days per week: Not on file    Minutes per session: Not on file  . Stress: Not on file  Relationships  . Social connections:    Talks on phone: Not on file    Gets together: Not on file    Attends religious service: Not on file    Active member of club or organization: Not on file    Attends meetings of clubs or organizations: Not on file    Relationship status: Not on file  . Intimate partner violence:    Fear of current or ex partner: Not on file    Emotionally abused: Not on file    Physically abused: Not on file    Forced sexual activity: Not on file  Other Topics Concern  . Not on file  Social History Narrative  . Not on file   Family History  Problem Relation Age of Onset  . Cancer Neg Hx   . Diabetes Neg Hx   . Heart disease Neg Hx       VITAL  SIGNS BP 120/66   Pulse 74   Temp (!) 96.8 F (36 C)   Resp 16   Ht 4\' 10"  (1.473 m)   Wt 104 lb 9.6 oz (47.4 kg)   SpO2 97%   BMI 21.86 kg/m   Outpatient Encounter Medications as of 12/22/2018  Medication Sig  . acetaminophen (TYLENOL) 325 MG tablet Take 650 mg by mouth 3 (three) times daily as needed (Knee pain).   Marland Kitchen aspirin EC 81 MG tablet Take 81 mg by mouth daily.   . busPIRone (BUSPAR) 15 MG tablet Take 15 mg by mouth 2 (two) times daily.  . Camphor-Menthol-Methyl Sal 03-09-29 % CREA Apply 1 application topically 2 (two) times daily as needed.  . candesartan (ATACAND) 32 MG tablet TAKE 1 TABLET DAILY  . Cholecalciferol 100 MCG (4000 UT) CAPS Take 1 capsule by mouth daily.  . diclofenac sodium (VOLTAREN) 1 % GEL Apply 2 g topically 4 (four) times daily as needed (Knee pain).   Marland Kitchen docusate sodium (COLACE) 100 MG capsule Take 100 mg by mouth daily as needed (for constipation if Miralax does not work.).  Marland Kitchen LORazepam  (ATIVAN) 0.5 MG tablet Take 0.5 mg by mouth daily as needed for anxiety.   . NON FORMULARY Diet Type:  Regular  . omeprazole (PRILOSEC) 40 MG capsule TAKE 1 CAPSULE DAILY  . polyethylene glycol (MIRALAX / GLYCOLAX) packet Take 17 g by mouth daily.  . propafenone (RYTHMOL) 225 MG tablet TAKE 1 TABLET BY MOUTH TWICE A DAY  . raloxifene (EVISTA) 60 MG tablet TAKE 1 TABLET DAILY  . sertraline (ZOLOFT) 100 MG tablet Take 100 mg by mouth at bedtime.   . traMADol (ULTRAM) 50 MG tablet Take 1 tablet (50 mg total) by mouth every 12 (twelve) hours as needed for moderate pain or severe pain.  . trospium (SANCTURA) 20 MG tablet TAKE 1 TABLET TWICE A DAY  . vitamin B-12 (CYANOCOBALAMIN) 1000 MCG tablet Take 1,000 mcg by mouth daily.   Marland Kitchen amLODipine (NORVASC) 2.5 MG tablet Take 2.5 mg by mouth daily.  . [DISCONTINUED] enoxaparin (LOVENOX) 30 MG/0.3ML injection Inject 0.3 mLs (30 mg total) into the skin daily for 14 days.  . [DISCONTINUED] hydrOXYzine (ATARAX/VISTARIL) 25 MG tablet Take 25 mg by mouth 3 (three) times daily as needed for itching.   No facility-administered encounter medications on file as of 12/22/2018.      SIGNIFICANT DIAGNOSTIC EXAMS   PREVIOUS ;  12-04-18: left hip x-ray: Acute subcapital fracture left hip.  NO NEW EXAMS.   LABS REVIEWED PREVIOUS:   12-04-18: wbc 10.2; hgb 11.8; hct 37.1; mcv 87.7; plt 300; glucose 147; bun 16; creat 0.64; k+ 3.1; na++ 137; ca 8.8  12-05-18: wbc 6.5; hgb 10.6; hct 33.2; mcv 88.5; plt 259;  12-06-17: wbc 8.9; hgb 11.1; hct 34.9; mcv 87.0; plt 265; glucose 118; bun 10; creat 0.51; k+ 2.9; na++ 137; ca 7.9 mag 1.8  12-07-18: wbc 8.1 hgb 11.5; hct 36.1; mcv 87.2; plt 268; glucose 103;bun 11; creat 0.56; k+ 3.2; na++ 135; ca 8.4  12-09-18: glucose 108; bun 21; creat 0.70; k+ 5.0; na++ 131; ca 8.4   NO NEW LABS   Review of Systems  Constitutional: Negative for malaise/fatigue.  Respiratory: Negative for cough and shortness of breath.   Cardiovascular:  Negative for chest pain and leg swelling.  Gastrointestinal: Negative for abdominal pain.  Musculoskeletal: Negative for back pain and joint pain.  Skin: Negative.   Psychiatric/Behavioral: The patient is not nervous/anxious.  Physical Exam Constitutional:      General: She is not in acute distress.    Appearance: She is well-developed. She is not diaphoretic.  Neck:     Musculoskeletal: Neck supple.     Thyroid: No thyromegaly.  Cardiovascular:     Rate and Rhythm: Normal rate and regular rhythm.     Pulses: Normal pulses.     Heart sounds: Normal heart sounds.  Pulmonary:     Effort: Pulmonary effort is normal. No respiratory distress.     Breath sounds: Normal breath sounds.  Abdominal:     General: Bowel sounds are normal. There is no distension.     Palpations: Abdomen is soft.     Tenderness: There is no abdominal tenderness.  Musculoskeletal:     Right lower leg: No edema.     Left lower leg: No edema.     Comments:  Is able to move all extremities Is status post left hip fracture      Lymphadenopathy:     Cervical: No cervical adenopathy.  Skin:    General: Skin is warm and dry.  Neurological:     Mental Status: She is alert. Mental status is at baseline.  Psychiatric:        Mood and Affect: Mood normal.       ASSESSMENT/ PLAN:  TODAY:   1. Chronic constipation: is stable will continue miralax daily and colace daily as needed  2. Over active bladder: is stable will continue sanctura 20 mg twice daily   3. Closed left hip fracture, sequela is status post left hip pinning: is stable will continue therapy as directed; will follow up with orthopedics as indicated;  will stop ultram at this time.   PREVIOUS  4. Post menopausal osteoporosis: is without change will continue evista 60 mg daily   5. Depression with anxiety: is stable will continue buspar 15 mg twice daily; zoloft 100 mg daily and has ativan 0.5 mg daily as needed through 11-29-19   6.  Long standing persistent atrial fibrillation: heart rate stable will continue asa 81 mg daily and rhythmol 225 mg twice daily for rate control   7. Benign essential hypertensin: is stable b/p 120/66: will continue norvasc 2.5 mg daily and atacard 32 mg daily   8. GERD without esophagitis: is stable will continue prilosec 40 mg daily    MD is aware of resident's narcotic use and is in agreement with current plan of care. We will attempt to wean resident as apropriate   Synthia Innocent NP [facility]  Contact [phone] Monday through Friday 8am- 5pm  After hours call [phone]

## 2018-12-29 ENCOUNTER — Encounter: Payer: Self-pay | Admitting: Adult Health

## 2018-12-29 ENCOUNTER — Other Ambulatory Visit: Payer: Self-pay | Admitting: Adult Health

## 2018-12-29 ENCOUNTER — Non-Acute Institutional Stay (SKILLED_NURSING_FACILITY): Payer: Medicare Other | Admitting: Adult Health

## 2018-12-29 DIAGNOSIS — F418 Other specified anxiety disorders: Secondary | ICD-10-CM | POA: Diagnosis not present

## 2018-12-29 DIAGNOSIS — I10 Essential (primary) hypertension: Secondary | ICD-10-CM | POA: Diagnosis not present

## 2018-12-29 DIAGNOSIS — G894 Chronic pain syndrome: Secondary | ICD-10-CM

## 2018-12-29 DIAGNOSIS — S72002S Fracture of unspecified part of neck of left femur, sequela: Secondary | ICD-10-CM

## 2018-12-29 NOTE — Progress Notes (Signed)
[facility] Room Number: 210 A Place of Service:  SNF (31)    CODE STATUS: DNR  Allergies  Allergen Reactions  . Gabapentin Other (See Comments)    Pt states that she felt weak, trembling and lightheaded    Chief Complaint  Patient presents with  . Discharge Note    [facility]    HPI:  She is being discharged to assisted living with home health for pt/ot/rn. She will need a light weight wheelchair and a  Semi-electric bed. She will not need any prescriptions written the receiving facility will be providing her medications. She will follow up medically with the provider at the receiving facility. She had been hospitalized for a left hip fracture. She was admitted to this facility for short term rehab. She has completed her SNF rehab and will return to her assisted living.    Past Medical History:  Diagnosis Date  . AF (atrial fibrillation) (HCC)   . Arthritis   . Constipation   . Cystocele   . GERD (gastroesophageal reflux disease)   . Hypertension   . Nocturia   . Osteoporosis   . Stroke (HCC) 04/2018   TIA  . Urethral caruncle   . Urinary incontinence   . Vaginal atrophy     Past Surgical History:  Procedure Laterality Date  . CARPAL TUNNEL RELEASE    . HIP PINNING,CANNULATED Left 12/05/2018   Procedure: CANNULATED HIP PINNING;  Surgeon: Christena FlakePoggi, John J, MD;  Location: [facility] ORS;  Service: Orthopedics;  Laterality: Left;  . NO PAST SURGERIES      Social History   Socioeconomic History  . Marital status: Widowed    Spouse name: Not on file  . Number of children: Not on file  . Years of education: Not on file  . Highest education level: Not on file  Occupational History  . Not on file  Social Needs  . Financial resource strain: Not on file  . Food insecurity:    Worry: Not on file    Inability: Not on file  . Transportation needs:    Medical: Not on file    Non-medical: Not on file    Tobacco Use  . Smoking status: Never Smoker  . Smokeless tobacco: Never Used  Substance and Sexual Activity  . Alcohol use: Yes    Comment: occas  . Drug use: No  . Sexual activity: Not Currently    Birth control/protection: Post-menopausal  Lifestyle  . Physical activity:    Days per week: Not on file    Minutes per session: Not on file  . Stress: Not on file  Relationships  . Social connections:    Talks on phone: Not on file    Gets together: Not on file    Attends religious service: Not on file    Active member of club or organization: Not on file    Attends meetings of clubs or organizations: Not on file    Relationship status: Not on file  . Intimate partner violence:    Fear of current or ex partner: Not on file    Emotionally abused: Not on file    Physically abused: Not on file    Forced sexual activity: Not on file  Other Topics Concern  . Not on file  Social History Narrative  . Not on file   Family History  Problem Relation Age of Onset  . Cancer Neg Hx   .  Diabetes Neg Hx   . Heart disease Neg Hx     VITAL SIGNS BP (!) 138/49   Pulse 75   Temp 97.9 F (36.6 C)   Resp 18   Ht 4\' 10"  (1.473 m)   Wt 106 lb (48.1 kg)   SpO2 97%   BMI 22.15 kg/m   Patient's Medications  New Prescriptions   No medications on file  Previous Medications   ACETAMINOPHEN (TYLENOL) 325 MG TABLET    Take 650 mg by mouth 3 (three) times daily as needed (Knee pain).    AMLODIPINE (NORVASC) 2.5 MG TABLET    Take 2.5 mg by mouth daily.   ASPIRIN EC 81 MG TABLET    Take 81 mg by mouth daily.    BUSPIRONE (BUSPAR) 15 MG TABLET    Take 15 mg by mouth 2 (two) times daily.   CAMPHOR-MENTHOL-METHYL SAL 03-09-29 % CREA    Apply 1 application topically 2 (two) times daily as needed.   CANDESARTAN (ATACAND) 32 MG TABLET    TAKE 1 TABLET DAILY   CHOLECALCIFEROL 100 MCG (4000 UT) CAPS    Take 1 capsule by mouth daily.   DICLOFENAC SODIUM (VOLTAREN) 1 % GEL    Apply 2 g topically 4  (four) times daily as needed (Knee pain).    DOCUSATE SODIUM (COLACE) 100 MG CAPSULE    Take 100 mg by mouth daily as needed (for constipation if Miralax does not work.).   LORAZEPAM (ATIVAN) 0.5 MG TABLET    Take 0.5 mg by mouth daily as needed for anxiety.    NON FORMULARY    Diet Type:  Regular   OMEPRAZOLE (PRILOSEC) 40 MG CAPSULE    TAKE 1 CAPSULE DAILY   POLYETHYLENE GLYCOL (MIRALAX / GLYCOLAX) PACKET    Take 17 g by mouth daily.   PROPAFENONE (RYTHMOL) 225 MG TABLET    TAKE 1 TABLET BY MOUTH TWICE A DAY   RALOXIFENE (EVISTA) 60 MG TABLET    TAKE 1 TABLET DAILY   SERTRALINE (ZOLOFT) 100 MG TABLET    Take 100 mg by mouth at bedtime.    TROSPIUM (SANCTURA) 20 MG TABLET    TAKE 1 TABLET TWICE A DAY   VITAMIN B-12 (CYANOCOBALAMIN) 1000 MCG TABLET    Take 1,000 mcg by mouth daily.   Modified Medications   No medications on file  Discontinued Medications   No medications on file     SIGNIFICANT DIAGNOSTIC EXAMS  PREVIOUS ;  12-04-18: left hip x-ray: Acute subcapital fracture left hip.  NO NEW EXAMS.   LABS REVIEWED PREVIOUS:   12-04-18: wbc 10.2; hgb 11.8; hct 37.1; mcv 87.7; plt 300; glucose 147; bun 16; creat 0.64; k+ 3.1; na++ 137; ca 8.8  12-05-18: wbc 6.5; hgb 10.6; hct 33.2; mcv 88.5; plt 259;  12-06-17: wbc 8.9; hgb 11.1; hct 34.9; mcv 87.0; plt 265; glucose 118; bun 10; creat 0.51; k+ 2.9; na++ 137; ca 7.9 mag 1.8  12-07-18: wbc 8.1 hgb 11.5; hct 36.1; mcv 87.2; plt 268; glucose 103;bun 11; creat 0.56; k+ 3.2; na++ 135; ca 8.4  12-09-18: glucose 108; bun 21; creat 0.70; k+ 5.0; na++ 131; ca 8.4   NO NEW LABS   Review of Systems  Constitutional: Negative for malaise/fatigue.  Respiratory: Negative for cough and shortness of breath.   Cardiovascular: Negative for chest pain, palpitations and leg swelling.  Gastrointestinal: Negative for abdominal pain, constipation and heartburn.  Musculoskeletal: Negative for back pain, joint pain and myalgias.  Skin: Negative.   Neurological:  Negative for dizziness.  Psychiatric/Behavioral: The patient is not nervous/anxious.     Physical Exam Constitutional:      General: She is not in acute distress.    Appearance: She is well-developed. She is not diaphoretic.  Neck:     Musculoskeletal: Neck supple.     Thyroid: No thyromegaly.  Cardiovascular:     Rate and Rhythm: Normal rate and regular rhythm.     Pulses: Normal pulses.     Heart sounds: Normal heart sounds.  Pulmonary:     Effort: Pulmonary effort is normal. No respiratory distress.     Breath sounds: Normal breath sounds.  Abdominal:     General: Bowel sounds are normal. There is no distension.     Palpations: Abdomen is soft.     Tenderness: There is no abdominal tenderness.  Musculoskeletal:     Right lower leg: No edema.     Left lower leg: No edema.     Comments: Is able to move all extremities Is status post left hip fracture       Lymphadenopathy:     Cervical: No cervical adenopathy.  Skin:    General: Skin is warm and dry.  Neurological:     Mental Status: She is alert. Mental status is at baseline.  Psychiatric:        Mood and Affect: Mood normal.       ASSESSMENT/ PLAN:   Patient is being discharged with the following home health services:  Pt/ot/rn: to evaluate and treat as indicated for gait balance strength and adl training.   Patient is being discharged with the following durable medical equipment: light weight wheelchair with elevated leg rests; cushion anti-tippers and brake extensions to allow her to maintain her current level of independence with her adls which cannot be achieved with a walker; can self propel requires light weight chair due to arm weakness Semi-electric bed due to her left hip fracture pain and poor mobility and requires frequent position changes which cannot be achieved with a standard bed.     Patient has been advised to f/u with their PCP in 1-2 weeks to bring them up to date on their rehab stay.  Social  services at facility was responsible for arranging this appointment.  Pt was provided with a 30 day supply of prescriptions for medications and refills must be obtained from their PCP.  For controlled substances, a more limited supply may be provided adequate until PCP appointment only.  Medications will be provided at the receiving facility.   Time spent with patient and family: 40 minutes: for medications; home health needs and dme; verbalized understanding.   Synthia Innocenteborah Adalee Kathan NP [facility]  Contact [phone] Monday through Friday 8am- 5pm  After hours call [phone]

## 2018-12-30 ENCOUNTER — Other Ambulatory Visit: Payer: Self-pay | Admitting: Adult Health

## 2019-01-30 ENCOUNTER — Emergency Department
Admission: EM | Admit: 2019-01-30 | Discharge: 2019-01-31 | Disposition: A | Discharge status: Home / Self Care | Payer: Medicare Other | Attending: Emergency Medicine | Admitting: Emergency Medicine

## 2019-01-30 ENCOUNTER — Other Ambulatory Visit: Payer: Self-pay

## 2019-01-30 ENCOUNTER — Emergency Department: Payer: Medicare Other

## 2019-01-30 DIAGNOSIS — S0990XA Unspecified injury of head, initial encounter: Secondary | ICD-10-CM | POA: Diagnosis present

## 2019-01-30 DIAGNOSIS — Z8673 Personal history of transient ischemic attack (TIA), and cerebral infarction without residual deficits: Secondary | ICD-10-CM | POA: Insufficient documentation

## 2019-01-30 DIAGNOSIS — I10 Essential (primary) hypertension: Secondary | ICD-10-CM | POA: Insufficient documentation

## 2019-01-30 DIAGNOSIS — Y939 Activity, unspecified: Secondary | ICD-10-CM | POA: Diagnosis not present

## 2019-01-30 DIAGNOSIS — Y999 Unspecified external cause status: Secondary | ICD-10-CM | POA: Diagnosis not present

## 2019-01-30 DIAGNOSIS — Y92122 Bedroom in nursing home as the place of occurrence of the external cause: Secondary | ICD-10-CM | POA: Insufficient documentation

## 2019-01-30 DIAGNOSIS — Z79899 Other long term (current) drug therapy: Secondary | ICD-10-CM | POA: Diagnosis not present

## 2019-01-30 DIAGNOSIS — W19XXXA Unspecified fall, initial encounter: Secondary | ICD-10-CM | POA: Insufficient documentation

## 2019-01-30 LAB — CBC WITH DIFFERENTIAL/PLATELET
Abs Immature Granulocytes: 0.03 10*3/uL (ref 0.00–0.07)
Basophils Absolute: 0 10*3/uL (ref 0.0–0.1)
Basophils Relative: 1 %
Eosinophils Absolute: 0.4 10*3/uL (ref 0.0–0.5)
Eosinophils Relative: 5 %
HCT: 34.9 % — ABNORMAL LOW (ref 36.0–46.0)
Hemoglobin: 10.7 g/dL — ABNORMAL LOW (ref 12.0–15.0)
Immature Granulocytes: 0 %
LYMPHS PCT: 29 %
Lymphs Abs: 2.5 10*3/uL (ref 0.7–4.0)
MCH: 26.2 pg (ref 26.0–34.0)
MCHC: 30.7 g/dL (ref 30.0–36.0)
MCV: 85.3 fL (ref 80.0–100.0)
Monocytes Absolute: 0.8 10*3/uL (ref 0.1–1.0)
Monocytes Relative: 10 %
Neutro Abs: 4.9 10*3/uL (ref 1.7–7.7)
Neutrophils Relative %: 55 %
Platelets: 364 10*3/uL (ref 150–400)
RBC: 4.09 MIL/uL (ref 3.87–5.11)
RDW: 16.4 % — ABNORMAL HIGH (ref 11.5–15.5)
WBC: 8.8 10*3/uL (ref 4.0–10.5)
nRBC: 0 % (ref 0.0–0.2)

## 2019-01-30 NOTE — ED Triage Notes (Signed)
Pt arrived via [facility] from [facility] with c/o unwitnessed fall. EMS states that pt was in room walking back to bed with walker and tripped and fell. EMS states that pt said that she "woke up" after her fall. EMS states that pt has been at facility x1 day and unsure of blood thinners.

## 2019-01-30 NOTE — ED Notes (Signed)
Pt Alert to self and situation. Pt is able to answer most questions appropriately.

## 2019-01-30 NOTE — ED Provider Notes (Signed)
[facility] Emergency Department Provider Note ____________________   First MD Initiated Contact with Patient 01/30/19 2310     (approximate)  I have reviewed the triage vital signs and the nursing notes.   HISTORY  Chief Complaint Fall    HPI Amber Holland is a 85 y.o. female to the emergency department following unwitnessed fall.  Patient is a new resident at [facility] nursing facility as of today and states that she was attempting to get back into bed when she fell.  Patient does not recall the reason for her fall stating that she did not trip or slip.  Patient denies any preceding symptoms.  Patient denies any complaints at present.  Patient denies any chest pain or shortness of breath no headache nausea or vomiting.  Is any dizziness.  Patient denies any loss of consciousness.        Past Medical History:  Diagnosis Date  . AF (atrial fibrillation) (HCC)   . Arthritis   . Constipation   . Cystocele   . GERD (gastroesophageal reflux disease)   . Hypertension   . Nocturia   . Osteoporosis   . Stroke (HCC) 04/2018   TIA  . Urethral caruncle   . Urinary incontinence   . Vaginal atrophy     Patient Active Problem List   Diagnosis Date Noted  . GERD without esophagitis 12/12/2018  . Chronic constipation 12/12/2018  . Post-menopausal osteoporosis 12/12/2018  . Overactive bladder 12/12/2018  . Depression with anxiety 12/12/2018  . Hypokalemia 12/12/2018  . Closed left hip fracture (HCC) 12/04/2018  . Osteoarthritis of knee (Left) 04/03/2018  . Chronic knee pain (Primary Area of Pain) (Left) 03/31/2018  . Chronic pain syndrome 03/31/2018  . Pharmacologic therapy 03/31/2018  . Disorder of skeletal system 03/31/2018  . Problems influencing health status 03/31/2018  . Vaginal pessary in situ 03/16/2016  . Urinary incontinence 08/27/2015  . Cystocele, midline 08/27/2015  . Bilateral carpal tunnel syndrome 01/30/2015  . B12 deficiency  12/25/2014  . Hereditary and idiopathic neuropathy 12/25/2014  . Peripheral polyneuropathy 12/25/2014  . Atrial fibrillation (HCC) 05/09/2014  . Benign essential hypertension 05/09/2014    Past Surgical History:  Procedure Laterality Date  . CARPAL TUNNEL RELEASE    . HIP PINNING,CANNULATED Left 12/05/2018   Procedure: CANNULATED HIP PINNING;  Surgeon: Christena Flake, MD;  Location: [facility] ORS;  Service: Orthopedics;  Laterality: Left;  . NO PAST SURGERIES      Prior to Admission medications   Medication Sig Start Date End Date Taking? Authorizing Provider  acetaminophen (TYLENOL) 325 MG tablet Take 650 mg by mouth 3 (three) times daily as needed (Knee pain).     [provider]  amLODipine (NORVASC) 2.5 MG tablet Take 2.5 mg by mouth daily. 12/07/18   [provider]  aspirin EC 81 MG tablet Take 81 mg by mouth daily.     [provider]  busPIRone (BUSPAR) 15 MG tablet Take 15 mg by mouth 2 (two) times daily. 11/28/18 11/28/19  [provider]  Camphor-Menthol-Methyl Sal 03-09-29 % CREA Apply 1 application topically 2 (two) times daily as needed. 08/17/18   [provider]  candesartan (ATACAND) 32 MG tablet TAKE 1 TABLET DAILY 11/20/14   [provider]  Cholecalciferol 100 MCG (4000 UT) CAPS Take 1 capsule by mouth daily. 12/08/18   [provider]  diclofenac sodium (VOLTAREN) 1 % GEL Apply 2 g topically 4 (four) times daily as needed (Knee pain).  03/23/17   [provider]  docusate sodium (COLACE) 100 MG capsule Take 100 mg by mouth daily as needed (for constipation if Miralax does not work.).    [provider]  NON FORMULARY Diet Type:  Regular    [provider]  omeprazole (PRILOSEC) 40 MG capsule TAKE 1 CAPSULE DAILY 02/18/15   [provider]  polyethylene glycol (MIRALAX / GLYCOLAX) packet Take 17 g by mouth daily.    [provider]  propafenone (RYTHMOL) 225 MG tablet TAKE 1  TABLET BY MOUTH TWICE A DAY 03/04/15   [provider]  raloxifene (EVISTA) 60 MG tablet TAKE 1 TABLET DAILY 05/08/15   [provider]  sertraline (ZOLOFT) 100 MG tablet Take 100 mg by mouth at bedtime.  12/13/18 11/28/19  [provider]  trospium (SANCTURA) 20 MG tablet TAKE 1 TABLET TWICE A DAY 03/28/18   Hildred Laser, MD  vitamin B-12 (CYANOCOBALAMIN) 1000 MCG tablet Take 1,000 mcg by mouth daily.     [provider]    Allergies Gabapentin  Family History  Problem Relation Age of Onset  . Cancer Neg Hx   . Diabetes Neg Hx   . Heart disease Neg Hx     Social History Social History   Tobacco Use  . Smoking status: Never Smoker  . Smokeless tobacco: Never Used  Substance Use Topics  . Alcohol use: Yes    Comment: occas  . Drug use: No    Review of Systems Constitutional: No fever/chills Eyes: No visual changes. ENT: No sore throat. Cardiovascular: Denies chest pain. Respiratory: Denies shortness of breath. Gastrointestinal: No abdominal pain.  No nausea, no vomiting.  No diarrhea.  No constipation. Genitourinary: Negative for dysuria. Musculoskeletal: Negative for neck pain.  Negative for back pain. Integumentary: Positive for left temporal/periorbital abrasions. Neurological: Negative for headaches, focal weakness or numbness.   ____________________________________________   PHYSICAL EXAM:  VITAL SIGNS: ED Triage Vitals  Enc Vitals Group     BP 01/30/19 2314 (!) 171/50     Pulse Rate 01/30/19 2314 61     Resp 01/30/19 2314 18     Temp 01/30/19 2314 (!) 97.5 F (36.4 C)     Temp Source 01/30/19 2314 Oral     SpO2 01/30/19 2314 98 %     Weight 01/30/19 2311 49.9 kg (110 lb)     Height 01/30/19 2311 1.448 m ( )     Head Circumference --      Peak Flow --      Pain Score 01/30/19 2311 0     Pain Loc --      Pain Edu? --      Excl. in GC? --     Constitutional: Alert and oriented. Well appearing and in no acute  distress. Eyes: Conjunctivae are normal. PERRL. EOMI. Head: Left periorbital/temporal abrasions Mouth/Throat: Mucous membranes are moist. Oropharynx non-erythematous. Neck: No stridor.   Cardiovascular: Normal rate, regular rhythm. Good peripheral circulation. Grossly normal heart sounds. Respiratory: Normal respiratory effort.  No retractions. Lungs CTAB. Gastrointestinal: Soft and nontender. No distention.  Musculoskeletal: No lower extremity tenderness nor edema. No gross deformities of extremities. Neurologic:  Normal speech and language. No gross focal neurologic deficits are appreciated.  Skin:  Skin is warm, dry and intact. No rash noted. Psychiatric: Mood and affect are normal. Speech and behavior are normal.  ____________________________________________   LABS (all labs ordered are listed, but only abnormal results are displayed)  Labs Reviewed  BASIC  METABOLIC PANEL - Abnormal; Notable for the following components:      Result Value   Potassium 3.4 (*)    Glucose, Bld 110 (*)    Calcium 8.4 (*)    All other components within normal limits  CBC WITH DIFFERENTIAL/PLATELET - Abnormal; Notable for the following components:   Hemoglobin 10.7 (*)    HCT 34.9 (*)    RDW 16.4 (*)    All other components within normal limits  URINALYSIS, COMPLETE (UACMP) WITH MICROSCOPIC - Abnormal; Notable for the following components:   Color, Urine YELLOW (*)    APPearance CLEAR (*)    Hgb urine dipstick SMALL (*)    Leukocytes,Ua SMALL (*)    Bacteria, UA RARE (*)    All other components within normal limits  URINE CULTURE  CBG MONITORING, ED   ____________________________________________  EKG  ED ECG REPORT I, [place] N Filippo Puls, the attending physician, personally viewed and interpreted this ECG.   Date: 01/30/2019  EKG Time: 23:15 AM  Rate: 61  Rhythm: Normal sinus rhythm  Axis: Leftward axis  Intervals: Normal  ST&T Change:  None  ____________________________________________  RADIOLOGY I, [place] N Chibueze Beasley, personally viewed and evaluated these images (plain radiographs) as part of my medical decision making, as well as reviewing the written report by the radiologist.  ED MD interpretation: No acute intracranial abnormality noted on CT scan of the head per radiologist.  Official radiology report(s): Ct Head Wo Contrast  Result Date: 01/30/2019 CLINICAL DATA:  Initial evaluation for acute syncope. EXAM: CT HEAD WITHOUT CONTRAST TECHNIQUE: Contiguous axial images were obtained from the base of the skull through the vertex without intravenous contrast. COMPARISON:  Prior CT from 04/18/2014. FINDINGS: Brain: Generalized age-related cerebral atrophy with moderate chronic microvascular ischemic disease. No acute intracranial hemorrhage. No acute large vessel territory infarct. No mass lesion, midline shift or mass effect. Mild ventricular prominence related global parenchymal volume loss without hydrocephalus. No extra-axial fluid collection. Vascular: No hyperdense vessel. Calcified atherosclerosis at the skull base. Skull: Scalp soft tissues and calvarium within normal limits. Sinuses/Orbits: Globes and orbital soft tissues demonstrate no acute finding. Paranasal sinuses are clear. No mastoid effusion. Other: None. IMPRESSION: 1. No acute intracranial abnormality. 2. Generalized age-related cerebral atrophy with moderate chronic microvascular ischemic disease. Electronically Signed   By: Rise Mu M.D.   On: 01/30/2019 23:53      Procedures   ____________________________________________   INITIAL IMPRESSION / MDM / ASSESSMENT AND PLAN / ED COURSE  As part of my medical decision making, I reviewed the following data within the electronic MEDICAL RECORD NUMBER    85 year old female presented with above-stated history and physical exam secondary to fall.  CT scan of the head performed to evaluate for possible  intracranial abnormality however this was read as normal by the radiologist.  Laboratory data notable for 21-50 WBCs in the urine with rare bacteria patient given fosfomycin 3 g p.o. in the emergency department.  Urine culture obtained. ____________________________________________  FINAL CLINICAL IMPRESSION(S) / ED DIAGNOSES  Final diagnoses:  Injury of head, initial encounter     MEDICATIONS GIVEN DURING THIS VISIT:  Medications  fosfomycin (MONUROL) packet 3 g (has no administration in time range)     ED Discharge Orders    None       Note:  This document was prepared using Dragon voice recognition software and may include unintentional dictation errors.   Darci Current, MD 01/31/19 [phone]

## 2019-01-31 DIAGNOSIS — S0990XA Unspecified injury of head, initial encounter: Secondary | ICD-10-CM | POA: Diagnosis not present

## 2019-01-31 LAB — URINALYSIS, COMPLETE (UACMP) WITH MICROSCOPIC
Bilirubin Urine: NEGATIVE
Glucose, UA: NEGATIVE mg/dL
Ketones, ur: NEGATIVE mg/dL
NITRITE: NEGATIVE
Protein, ur: NEGATIVE mg/dL
Specific Gravity, Urine: 1.012 (ref 1.005–1.030)
pH: 7 (ref 5.0–8.0)

## 2019-01-31 LAB — BASIC METABOLIC PANEL
Anion gap: 7 (ref 5–15)
BUN: 18 mg/dL (ref 8–23)
CO2: 28 mmol/L (ref 22–32)
Calcium: 8.4 mg/dL — ABNORMAL LOW (ref 8.9–10.3)
Chloride: 105 mmol/L (ref 98–111)
Creatinine, Ser: 0.69 mg/dL (ref 0.44–1.00)
GFR calc Af Amer: >60 mL/min (ref >60)
GFR calc non Af Amer: >60 mL/min (ref >60)
Glucose, Bld: 110 mg/dL — ABNORMAL HIGH (ref 70–99)
Potassium: 3.4 mmol/L — ABNORMAL LOW (ref 3.5–5.1)
Sodium: 140 mmol/L (ref 135–145)

## 2019-01-31 LAB — SAMPLE TO BLOOD BANK

## 2019-01-31 MED ORDER — FOSFOMYCIN TROMETHAMINE 3 G PO PACK
3.0000 g | PACK | Freq: Once | ORAL | Status: AC
  Administered 2019-01-31: 3 g via ORAL
  Filled 2019-01-31: qty 3

## 2019-01-31 NOTE — ED Notes (Signed)
Dorian Tech cleaned pt laceration on left side of face

## 2019-01-31 NOTE — ED Notes (Signed)
Pt has Dementia and cannot sign for discharge.

## 2019-02-02 LAB — URINE CULTURE: Culture: >=100000 — AB

## 2019-02-03 NOTE — Progress Notes (Signed)
Patient received a dose of fosfomycin in the ED per the MD note. Fosfomycin should cover the bug growing in urine culture (PSA). A single dose regimen should be enough to treat a uncomplicated UTI.    Thanks,   Paschal Dopp, PharmD, BCPS.

## 2019-02-13 ENCOUNTER — Emergency Department
Admission: EM | Admit: 2019-02-13 | Discharge: 2019-02-13 | Disposition: A | Discharge status: Skilled Nursing Facility | Payer: Medicare Other | Attending: Student in an Organized Health Care Education/Training Program | Admitting: Student in an Organized Health Care Education/Training Program

## 2019-02-13 ENCOUNTER — Other Ambulatory Visit: Payer: Self-pay

## 2019-02-13 DIAGNOSIS — R400 Somnolence: Secondary | ICD-10-CM | POA: Diagnosis not present

## 2019-02-13 DIAGNOSIS — I10 Essential (primary) hypertension: Secondary | ICD-10-CM | POA: Insufficient documentation

## 2019-02-13 DIAGNOSIS — R531 Weakness: Secondary | ICD-10-CM | POA: Diagnosis present

## 2019-02-13 DIAGNOSIS — Z8673 Personal history of transient ischemic attack (TIA), and cerebral infarction without residual deficits: Secondary | ICD-10-CM | POA: Insufficient documentation

## 2019-02-13 DIAGNOSIS — Z7982 Long term (current) use of aspirin: Secondary | ICD-10-CM | POA: Insufficient documentation

## 2019-02-13 DIAGNOSIS — Z79899 Other long term (current) drug therapy: Secondary | ICD-10-CM | POA: Diagnosis not present

## 2019-02-13 NOTE — ED Provider Notes (Signed)
[facility] Emergency Department Provider Note    First MD Initiated Contact with Patient 02/13/19 1813     (approximate)  I have reviewed the triage vital signs and the nursing notes.   HISTORY  Chief Complaint Weakness    HPI TEWANDA DUITSMAN is a 85 y.o. female below listed past medical history presents the ER after taking trazodone this afternoon and appearing drowsy sent over to the right side and had difficulty holding her cup.  She was sent for further evaluation due to concern for stroke.  The daughter-in-law is currently at bedside and states that patient is looking well.  Denies any other complaints.  Agrees with no further diagnostic testing and requesting her to be transferred back to the facility.  Past Medical History:  Diagnosis Date  . AF (atrial fibrillation) (HCC)   . Arthritis   . Constipation   . Cystocele   . GERD (gastroesophageal reflux disease)   . Hypertension   . Nocturia   . Osteoporosis   . Stroke (HCC) 04/2018   TIA  . Urethral caruncle   . Urinary incontinence   . Vaginal atrophy    Family History  Problem Relation Age of Onset  . Cancer Neg Hx   . Diabetes Neg Hx   . Heart disease Neg Hx    Past Surgical History:  Procedure Laterality Date  . CARPAL TUNNEL RELEASE    . HIP PINNING,CANNULATED Left 12/05/2018   Procedure: CANNULATED HIP PINNING;  Surgeon: Christena Flake, MD;  Location: [facility] ORS;  Service: Orthopedics;  Laterality: Left;  . NO PAST SURGERIES     Patient Active Problem List   Diagnosis Date Noted  . GERD without esophagitis 12/12/2018  . Chronic constipation 12/12/2018  . Post-menopausal osteoporosis 12/12/2018  . Overactive bladder 12/12/2018  . Depression with anxiety 12/12/2018  . Hypokalemia 12/12/2018  . Closed left hip fracture (HCC) 12/04/2018  . Osteoarthritis of knee (Left) 04/03/2018  . Chronic knee pain (Primary Area of Pain) (Left) 03/31/2018  . Chronic pain syndrome  03/31/2018  . Pharmacologic therapy 03/31/2018  . Disorder of skeletal system 03/31/2018  . Problems influencing health status 03/31/2018  . Vaginal pessary in situ 03/16/2016  . Urinary incontinence 08/27/2015  . Cystocele, midline 08/27/2015  . Bilateral carpal tunnel syndrome 01/30/2015  . B12 deficiency 12/25/2014  . Hereditary and idiopathic neuropathy 12/25/2014  . Peripheral polyneuropathy 12/25/2014  . Atrial fibrillation (HCC) 05/09/2014  . Benign essential hypertension 05/09/2014      Prior to Admission medications   Medication Sig Start Date End Date Taking? Authorizing Provider  acetaminophen (TYLENOL) 325 MG tablet Take 650 mg by mouth 3 (three) times daily as needed (Knee pain).     [provider]  amLODipine (NORVASC) 2.5 MG tablet Take 2.5 mg by mouth daily. 12/07/18   [provider]  aspirin EC 81 MG tablet Take 81 mg by mouth daily.     [provider]  busPIRone (BUSPAR) 15 MG tablet Take 15 mg by mouth 2 (two) times daily. 11/28/18 11/28/19  [provider]  Camphor-Menthol-Methyl Sal 03-09-29 % CREA Apply 1 application topically 2 (two) times daily as needed. 08/17/18   [provider]  candesartan (ATACAND) 32 MG tablet TAKE 1 TABLET DAILY 11/20/14   [provider]  Cholecalciferol 100 MCG (4000 UT) CAPS Take 1 capsule by mouth daily. 12/08/18   [provider]  diclofenac sodium (VOLTAREN) 1 % GEL Apply 2 g topically 4 (  four) times daily as needed (Knee pain).  03/23/17   [provider]  docusate sodium (COLACE) 100 MG capsule Take 100 mg by mouth daily as needed (for constipation if Miralax does not work.).    [provider]  NON FORMULARY Diet Type:  Regular    [provider]  omeprazole (PRILOSEC) 40 MG capsule TAKE 1 CAPSULE DAILY 02/18/15   [provider]  polyethylene glycol (MIRALAX / GLYCOLAX) packet Take 17 g by mouth daily.    [provider]   propafenone (RYTHMOL) 225 MG tablet TAKE 1 TABLET BY MOUTH TWICE A DAY 03/04/15   [provider]  raloxifene (EVISTA) 60 MG tablet TAKE 1 TABLET DAILY 05/08/15   [provider]  sertraline (ZOLOFT) 100 MG tablet Take 100 mg by mouth at bedtime.  12/13/18 11/28/19  [provider]  trospium (SANCTURA) 20 MG tablet TAKE 1 TABLET TWICE A DAY 03/28/18   Hildred Laser, MD  vitamin B-12 (CYANOCOBALAMIN) 1000 MCG tablet Take 1,000 mcg by mouth daily.     [provider]    Allergies Gabapentin    Social History Social History   Tobacco Use  . Smoking status: Never Smoker  . Smokeless tobacco: Never Used  Substance Use Topics  . Alcohol use: Yes    Comment: occas  . Drug use: No    Review of Systems Patient denies headaches, rhinorrhea, blurry vision, numbness, shortness of breath, chest pain, edema, cough, abdominal pain, nausea, vomiting, diarrhea, dysuria, fevers, rashes or hallucinations unless otherwise stated above in HPI. ____________________________________________   PHYSICAL EXAM:  VITAL SIGNS: There were no vitals filed for this visit.  Constitutional: Alert and pleasantly demented. Well appearing and in no acute distress. Eyes: Conjunctivae are normal.  Head: Atraumatic. Nose: No congestion/rhinnorhea. Mouth/Throat: Mucous membranes are moist.   Neck: Painless ROM.  Cardiovascular:   Good peripheral circulation. Respiratory: Normal respiratory effort.  No retractions.  Gastrointestinal: Soft and nontender.  Musculoskeletal: No lower extremity tenderness .  No joint effusions. Neurologic:  Normal speech and language. MAE  Equal grip strength. No gross focal neurologic deficits are appreciated.  Skin:  Skin is warm, dry and intact. No rash noted. Psychiatric: Mood and affect are normal. Speech and behavior are normal.  ____________________________________________   LABS (all labs ordered are listed, but only abnormal results are  displayed)  No results found for this or any previous visit (from the past 24 hour(s)). ____________________________________________ ____________________________________________   PROCEDURES  Procedure(s) performed:  Procedures    Critical Care performed: no ____________________________________________   INITIAL IMPRESSION / ASSESSMENT AND PLAN / ED COURSE  Pertinent labs & imaging results that were available during my care of the patient were reviewed by me and considered in my medical decision making (see chart for details).  DDX: Medication effect, overdose, CVA  DEKLYN LIKELY is a 85 y.o. who presents to the ED with symptoms as described above.  Patient appears well.  Likely medication reaction.  Discussed goals of care and diagnostic testing options with the family at bedside and they agree with no additional testing.  States that they are also concerned as the patient has been being transferred to multiple different facilities and back-and-forth to the hospital likely making underlying confusion and dementia worse.      ____________________________________________   FINAL CLINICAL IMPRESSION(S) / ED DIAGNOSES  Final diagnoses:  Drowsiness      NEW MEDICATIONS STARTED DURING THIS VISIT:  New Prescriptions   No medications on  file     Note:  This document was prepared using Dragon voice recognition software and may include unintentional dictation errors.     Willy Eddy, MD 02/13/19 [phone]

## 2019-02-13 NOTE — ED Triage Notes (Signed)
Pt presents via ems from [place]. EMS was told that she was seen "normal" at 3Pm and then given a trazodone at 430. Shortly thereafter she was found in a wheelchair "slumped to the side." Pt is alert and able to follow commands during triage.

## 2019-02-13 NOTE — ED Notes (Signed)
Pt discharged home after verbalizing understanding of discharge instructions; nad noted. 

## 2019-02-13 NOTE — Discharge Instructions (Addendum)
Follow-up with doctors making house calls.  Return for any additional questions or concerns.

## 2019-03-02 ENCOUNTER — Telehealth: Payer: Self-pay | Admitting: Obstetrics and Gynecology

## 2019-03-02 NOTE — Telephone Encounter (Signed)
Pt's daughter called stating that her mother had fell and broke her hip and is currently in an Assisted Living and she has an UTI and her daughter wants to bring to the office to have her pessary removed since it has been over 2 months since she has had the pessary in. Pt's daughter was informed that I needed to speak to [facility] to see what she think is best for her mother.

## 2019-03-02 NOTE — Telephone Encounter (Signed)
The patients daughter called and stated that she needs to speak with Dr. Valentino Saxon or her nurse today if possible, in regards to some concerns she has for the patient. Please advise.

## 2019-03-03 NOTE — Telephone Encounter (Signed)
Pt's daughter was called no answer LM via voicemail the information given by [facility]. That was as following: Please let her daughter know that the pessary does not necessarily have to be removed unless it has been in place for more than 6 months unless it is causing her pain, or if there is a foul odor. If the nursing home is ok to use her maintenance cream/gel, then she can keep it in place until she gets better.

## 2019-03-09 ENCOUNTER — Other Ambulatory Visit: Payer: Self-pay | Admitting: Obstetrics and Gynecology

## 2019-03-09 DIAGNOSIS — R350 Frequency of micturition: Secondary | ICD-10-CM

## 2019-03-13 NOTE — Telephone Encounter (Signed)
Called the pt's daughter to see if she received the message I left her a week ago. Pt's daughter stated that she did, but is unsure about bringing her mother out due to COVID-19. Pt's daughter stated that as soon as everything clears up she would bring her in for a visit.

## 2020-04-25 IMAGING — XA DG HIP (WITH PELVIS) OPERATIVE*L*
2 series · 2 of 2 positions shown · non-contrast
Comparison: Left hip x-rays from yesterday.

CLINICAL DATA: Left hip pinning.

EXAM:
OPERATIVE LEFT HIP
TECHNIQUE: Fluoroscopic spot image(s) were submitted for interpretation
post-operatively. Fluoroscopy time was 46 seconds.

[Series 8: cont. · 1 of 1 slices shown (1 of 2)]
[im 1/1]
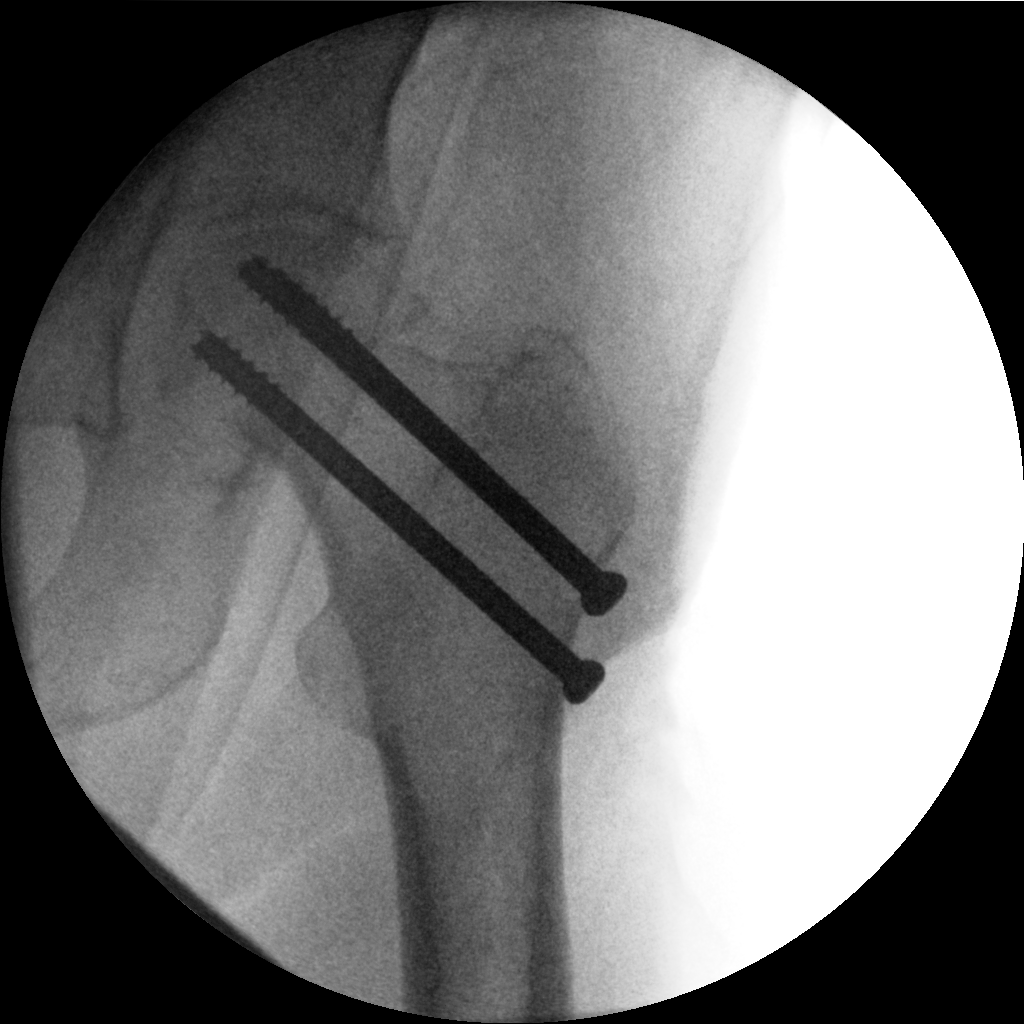

[Series 9: cont. · 1 of 1 slices shown (2 of 2)]
[im 1/1]
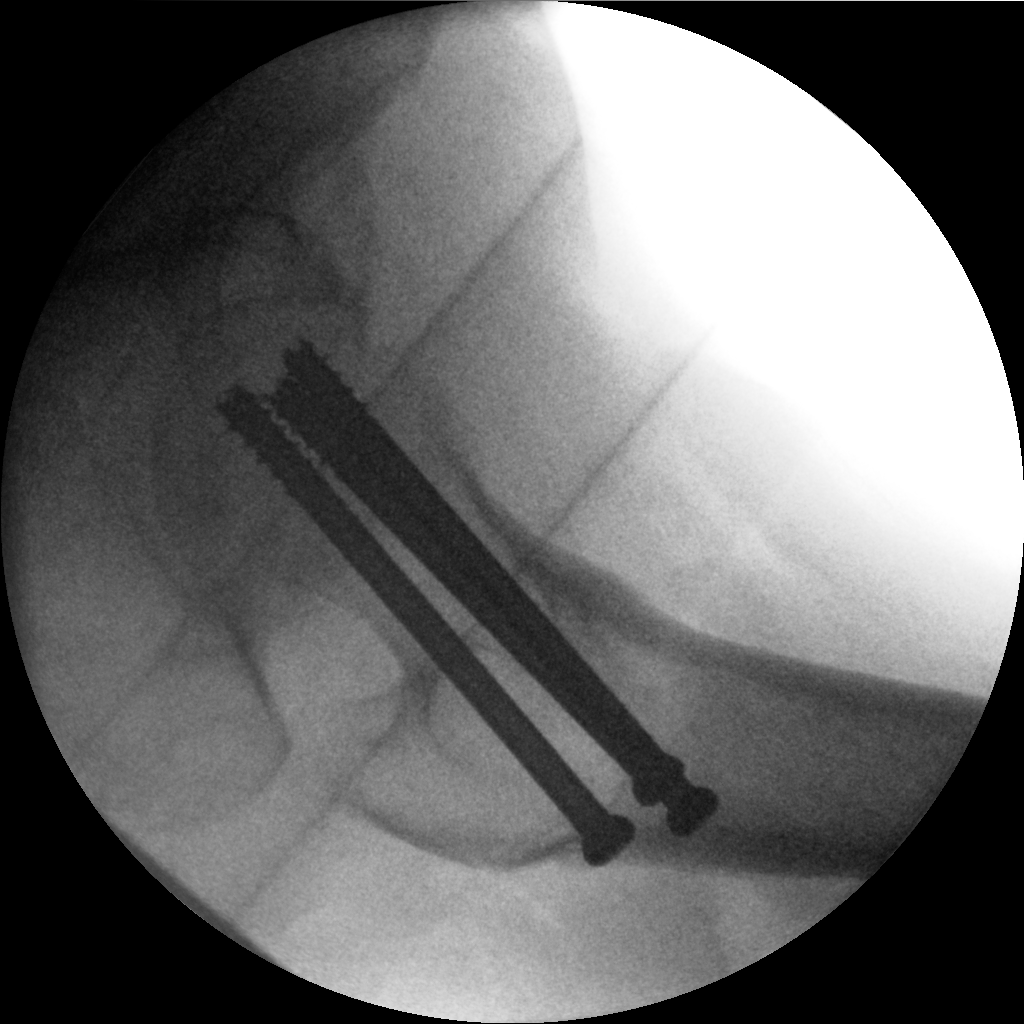

[2 of 2 positions shown; findings below may reference images not displayed]

FINDINGS: AP and lateral intraoperative fluoroscopic images demonstrate
interval placement of three partially cannulated screws within the
left femoral head and neck. Unchanged nondisplaced subcapital
femoral neck fracture. No dislocation.
IMPRESSION: 1. Intraoperative fluoroscopic guidance for left subcapital femoral
neck fracture pinning.

## 2020-05-30 DEATH — deceased

## 2020-06-20 IMAGING — CT CT HEAD W/O CM
3 series · 16 of 47 positions shown, 19 images · non-contrast
Comparison: Prior CT from 04/18/2014.

CLINICAL DATA: Initial evaluation for acute syncope.

EXAM:
CT HEAD WITHOUT CONTRAST
TECHNIQUE: Contiguous axial images were obtained from the base of the skull
through the vertex without intravenous contrast.

[Series 2: head wo · axial · 0.43mm/px · z∈[+502,+627]mm · 10 of 30 slices shown, 13 images]
[im 3/30  brain]
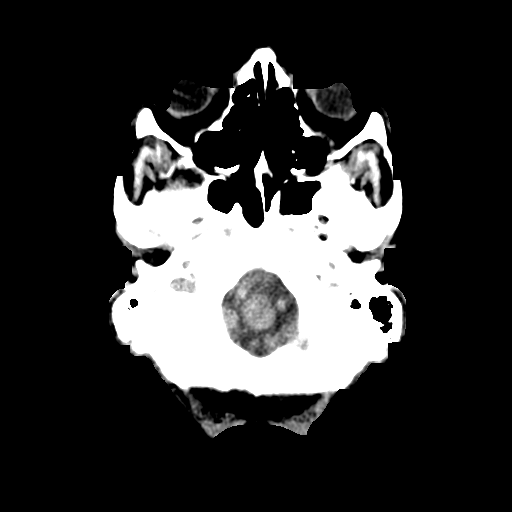
[im 3/30  bone]
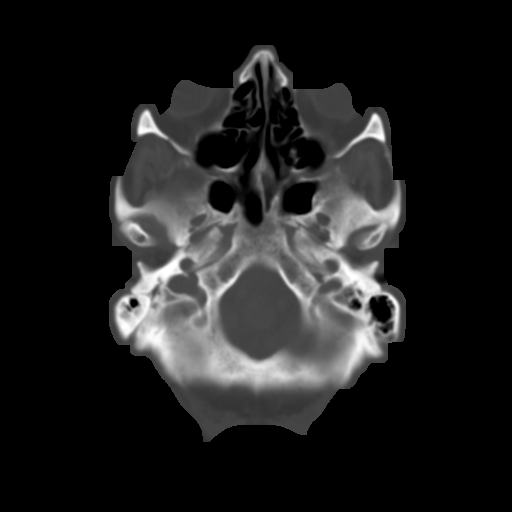
[im 6/30  brain]
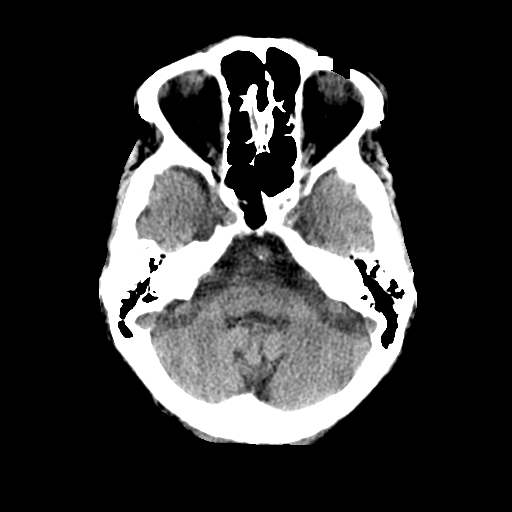
[im 9/30  brain]
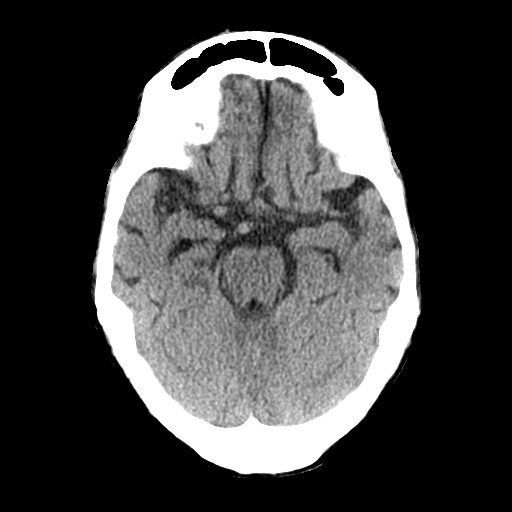
[im 11/30  brain]
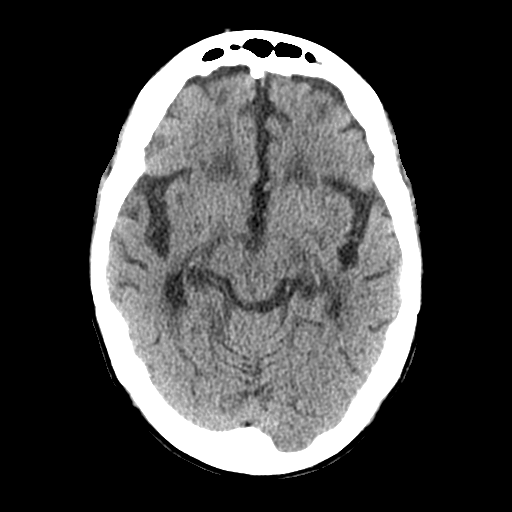
[im 14/30  brain]
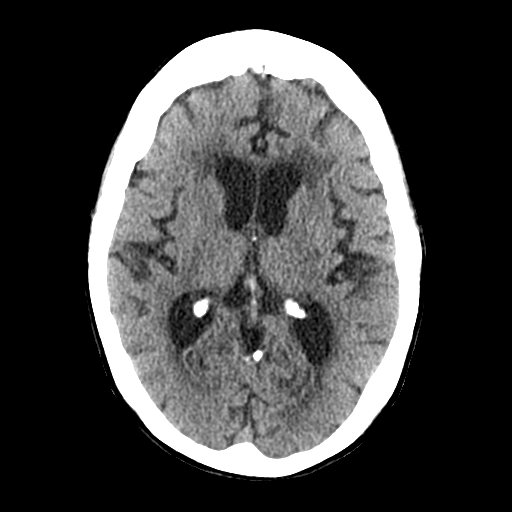
[im 14/30  bone]
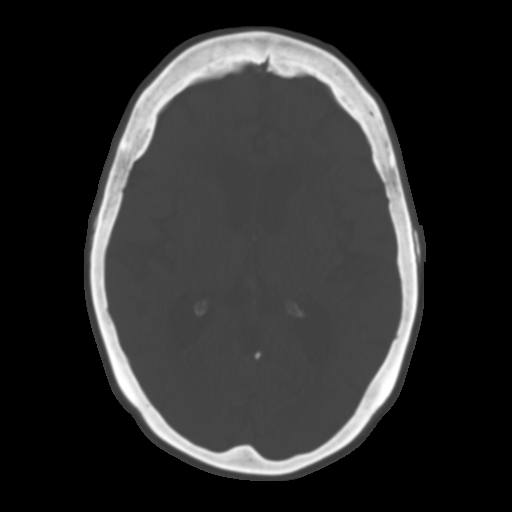
[im 17/30  brain]
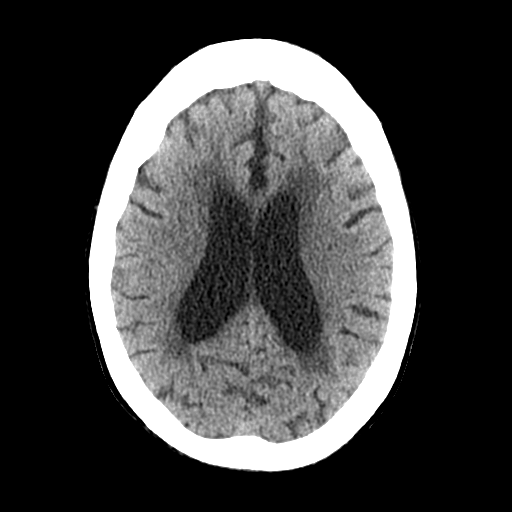
[im 20/30  brain]
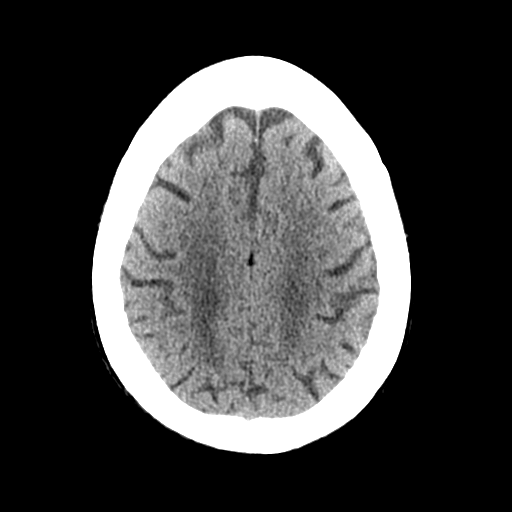
[im 23/30  brain]
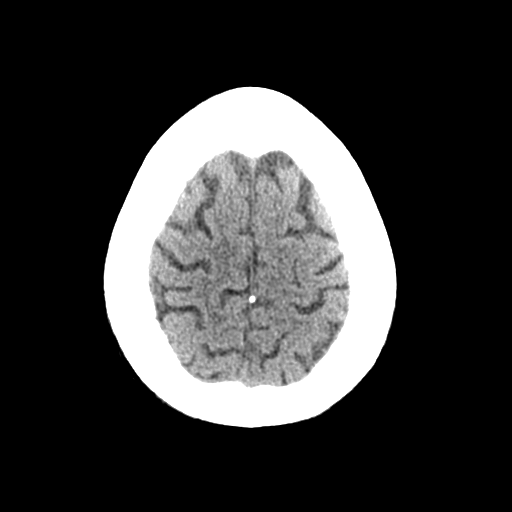
[im 25/30  brain]
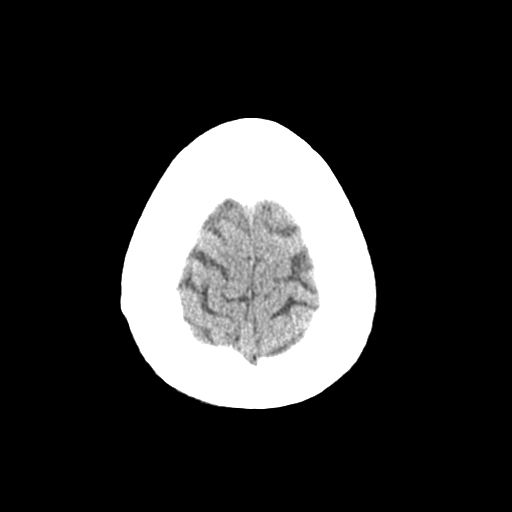
[im 25/30  bone]
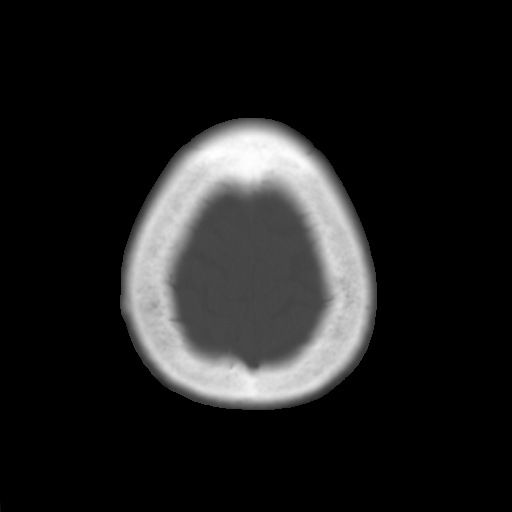
[im 28/30  brain]
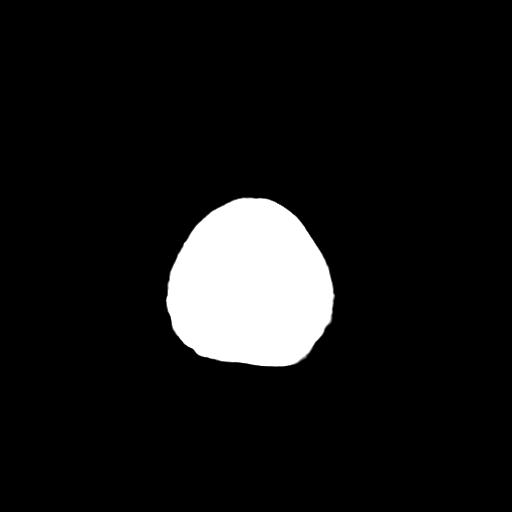

[Series 4: coronal soft tissue · coronal · 0.29mm/px · 3 of 65 slices shown]
[im 22/65  brain]
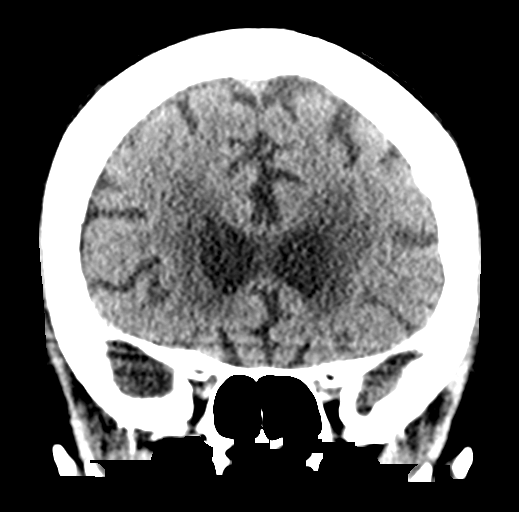
[im 29/65  brain]
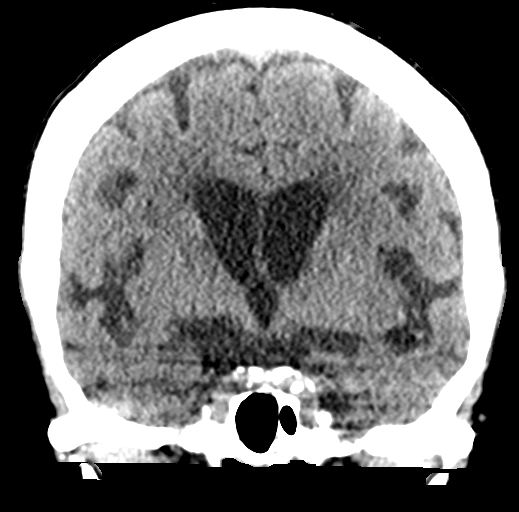
[im 36/65  brain]
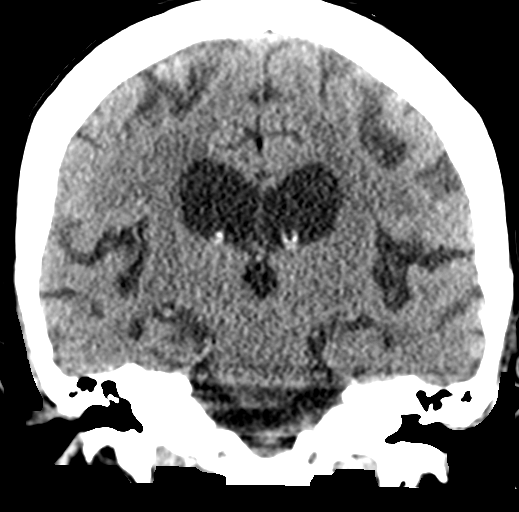

[Series 5: sagittal soft tissue · sagittal · 0.29mm/px · 3 of 48 slices shown]
[im 16/48  brain]
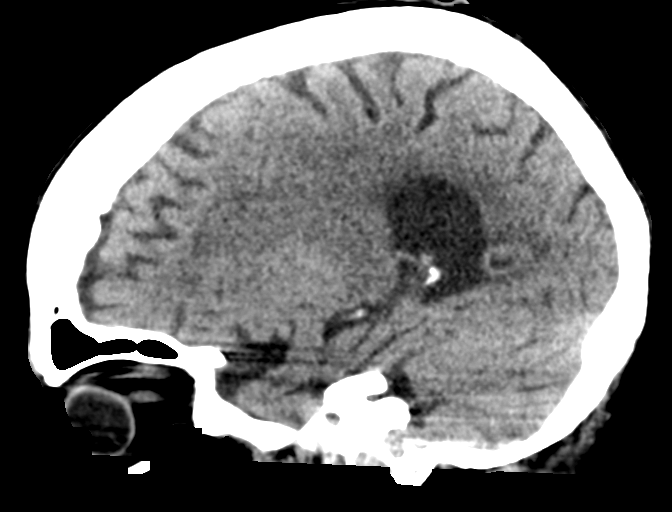
[im 24/48  brain]
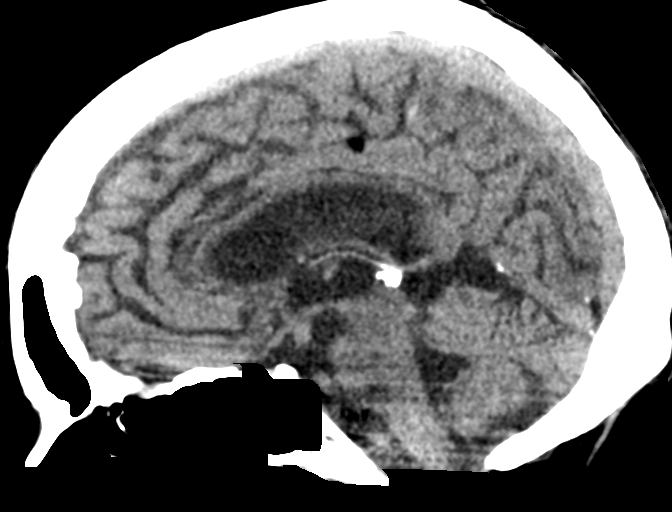
[im 32/48  brain]
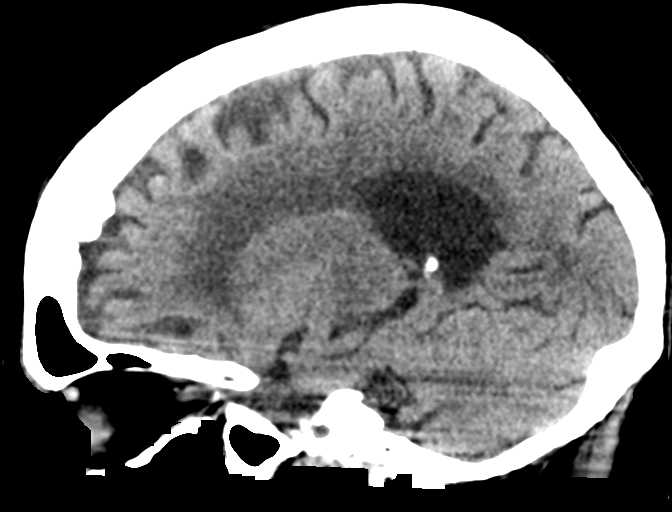

[16 of 47 positions shown; findings below may reference images not displayed]

FINDINGS: Brain: Generalized age-related cerebral atrophy with moderate
chronic microvascular ischemic disease. No acute intracranial
hemorrhage. No acute large vessel territory infarct. No mass lesion,
midline shift or mass effect. Mild ventricular prominence related
global parenchymal volume loss without hydrocephalus. No extra-axial
fluid collection.

Vascular: No hyperdense vessel. Calcified atherosclerosis at the
skull base.

Skull: Scalp soft tissues and calvarium within normal limits.

Sinuses/Orbits: Globes and orbital soft tissues demonstrate no acute
finding. Paranasal sinuses are clear. No mastoid effusion.

Other: None.
IMPRESSION: 1. No acute intracranial abnormality.
2. Generalized age-related cerebral atrophy with moderate chronic
microvascular ischemic disease.
